# Patient Record
Sex: Female | Born: 1970 | Race: White | Hispanic: No | Smoking: Former smoker
Health system: Southern US, Community
[De-identification: ages and names within clinical notes are randomized; demographics above are authoritative.]

## PROBLEM LIST (undated history)

## (undated) DIAGNOSIS — F419 Anxiety disorder, unspecified: Secondary | ICD-10-CM

## (undated) DIAGNOSIS — I10 Essential (primary) hypertension: Secondary | ICD-10-CM

## (undated) DIAGNOSIS — F32A Depression, unspecified: Secondary | ICD-10-CM

## (undated) DIAGNOSIS — T7840XA Allergy, unspecified, initial encounter: Secondary | ICD-10-CM

## (undated) DIAGNOSIS — I82409 Acute embolism and thrombosis of unspecified deep veins of unspecified lower extremity: Secondary | ICD-10-CM

## (undated) DIAGNOSIS — M1990 Unspecified osteoarthritis, unspecified site: Secondary | ICD-10-CM

## (undated) DIAGNOSIS — E119 Type 2 diabetes mellitus without complications: Secondary | ICD-10-CM

## (undated) DIAGNOSIS — K219 Gastro-esophageal reflux disease without esophagitis: Secondary | ICD-10-CM

## (undated) DIAGNOSIS — Z5189 Encounter for other specified aftercare: Secondary | ICD-10-CM

## (undated) DIAGNOSIS — J45909 Unspecified asthma, uncomplicated: Secondary | ICD-10-CM

## (undated) HISTORY — DX: Unspecified osteoarthritis, unspecified site: M19.90

## (undated) HISTORY — DX: Allergy, unspecified, initial encounter: T78.40XA

## (undated) HISTORY — PX: NASAL SINUS SURGERY: SHX719

## (undated) HISTORY — DX: Acute embolism and thrombosis of unspecified deep veins of unspecified lower extremity: I82.409

## (undated) HISTORY — DX: Depression, unspecified: F32.A

## (undated) HISTORY — PX: OOPHORECTOMY: SHX86

## (undated) HISTORY — DX: Unspecified asthma, uncomplicated: J45.909

## (undated) HISTORY — DX: Encounter for other specified aftercare: Z51.89

## (undated) HISTORY — PX: EXPLORATORY LAPAROTOMY: SUR591

## (undated) HISTORY — PX: APPENDECTOMY: SHX54

## (undated) HISTORY — PX: BREAST SURGERY: SHX581

## (undated) HISTORY — PX: SUPERIOR VENA CAVA ANGIOPLASTY / STENTING: SHX2469

---

## 1997-01-07 HISTORY — PX: BREAST EXCISIONAL BIOPSY: SUR124

## 2001-10-06 ENCOUNTER — Ambulatory Visit: Admission: RE | Admit: 2001-10-06 | Discharge: 2001-10-06 | Payer: Self-pay | Admitting: Gynecology

## 2004-02-17 ENCOUNTER — Emergency Department: Payer: Self-pay | Admitting: Emergency Medicine

## 2005-08-20 ENCOUNTER — Emergency Department: Payer: Self-pay | Admitting: Emergency Medicine

## 2005-10-26 ENCOUNTER — Emergency Department: Payer: Self-pay | Admitting: Emergency Medicine

## 2005-12-17 ENCOUNTER — Emergency Department: Payer: Self-pay | Admitting: Emergency Medicine

## 2006-10-05 ENCOUNTER — Emergency Department: Payer: Self-pay | Admitting: Emergency Medicine

## 2006-10-05 ENCOUNTER — Other Ambulatory Visit: Payer: Self-pay

## 2007-04-01 ENCOUNTER — Emergency Department: Payer: Self-pay | Admitting: Emergency Medicine

## 2007-05-10 ENCOUNTER — Emergency Department: Payer: Self-pay | Admitting: Internal Medicine

## 2007-07-17 ENCOUNTER — Emergency Department: Payer: Self-pay | Admitting: Emergency Medicine

## 2007-07-19 ENCOUNTER — Emergency Department: Payer: Self-pay | Admitting: Emergency Medicine

## 2007-10-08 ENCOUNTER — Emergency Department: Payer: Self-pay | Admitting: Emergency Medicine

## 2007-10-08 ENCOUNTER — Other Ambulatory Visit: Payer: Self-pay

## 2007-10-09 ENCOUNTER — Emergency Department: Payer: Self-pay | Admitting: Internal Medicine

## 2008-02-01 ENCOUNTER — Emergency Department: Payer: Self-pay | Admitting: Emergency Medicine

## 2008-09-14 ENCOUNTER — Emergency Department: Payer: Self-pay | Admitting: Emergency Medicine

## 2009-02-04 ENCOUNTER — Emergency Department: Payer: Self-pay | Admitting: Emergency Medicine

## 2009-04-07 ENCOUNTER — Emergency Department: Payer: Self-pay

## 2009-04-21 ENCOUNTER — Emergency Department: Payer: Self-pay | Admitting: Emergency Medicine

## 2009-04-22 ENCOUNTER — Emergency Department: Payer: Self-pay | Admitting: Emergency Medicine

## 2009-04-28 ENCOUNTER — Emergency Department: Payer: Self-pay | Admitting: Internal Medicine

## 2009-07-03 DIAGNOSIS — G4700 Insomnia, unspecified: Secondary | ICD-10-CM | POA: Insufficient documentation

## 2009-08-15 ENCOUNTER — Emergency Department: Payer: Self-pay | Admitting: Emergency Medicine

## 2009-09-04 ENCOUNTER — Emergency Department: Payer: Self-pay | Admitting: Emergency Medicine

## 2010-07-23 ENCOUNTER — Emergency Department: Payer: Self-pay | Admitting: Emergency Medicine

## 2011-03-04 ENCOUNTER — Emergency Department: Payer: Self-pay | Admitting: Emergency Medicine

## 2011-03-04 LAB — CBC
HGB: 11.4 g/dL — ABNORMAL LOW (ref 12.0–16.0)
MCHC: 33.3 g/dL (ref 32.0–36.0)
Platelet: 276 10*3/uL (ref 150–440)
RBC: 3.71 10*6/uL — ABNORMAL LOW (ref 3.80–5.20)

## 2011-03-04 LAB — COMPREHENSIVE METABOLIC PANEL
Alkaline Phosphatase: 103 U/L (ref 50–136)
Bilirubin,Total: 0.2 mg/dL (ref 0.2–1.0)
Calcium, Total: 9.5 mg/dL (ref 8.5–10.1)
Chloride: 100 mmol/L (ref 98–107)
EGFR (African American): >60
EGFR (Non-African Amer.): >60
Glucose: 147 mg/dL — ABNORMAL HIGH (ref 65–99)
Potassium: 4.2 mmol/L (ref 3.5–5.1)
SGOT(AST): 23 U/L (ref 15–37)
SGPT (ALT): 28 U/L
Sodium: 140 mmol/L (ref 136–145)
Total Protein: 7.7 g/dL (ref 6.4–8.2)

## 2011-03-04 LAB — URINALYSIS, COMPLETE
Bacteria: NONE SEEN
Bilirubin,UR: NEGATIVE
Glucose,UR: NEGATIVE mg/dL (ref 0–75)
Nitrite: NEGATIVE
Protein: NEGATIVE
RBC,UR: 1 /HPF (ref 0–5)
Specific Gravity: 1.008 (ref 1.003–1.030)
Squamous Epithelial: 1
WBC UR: <1 /HPF (ref 0–5)

## 2011-03-04 LAB — CK TOTAL AND CKMB (NOT AT ARMC)
CK, Total: 81 U/L (ref 21–215)
CK-MB: 0.5 ng/mL (ref 0.5–3.6)

## 2011-03-04 LAB — TROPONIN I: Troponin-I: <0.02 ng/mL

## 2011-05-27 ENCOUNTER — Emergency Department: Payer: Self-pay | Admitting: *Deleted

## 2011-05-27 LAB — COMPREHENSIVE METABOLIC PANEL
Albumin: 3.4 g/dL (ref 3.4–5.0)
Alkaline Phosphatase: 110 U/L (ref 50–136)
Bilirubin,Total: 0.3 mg/dL (ref 0.2–1.0)
Calcium, Total: 8.9 mg/dL (ref 8.5–10.1)
Chloride: 105 mmol/L (ref 98–107)
EGFR (African American): >60
EGFR (Non-African Amer.): >60
Osmolality: 278 (ref 275–301)
Potassium: 4.2 mmol/L (ref 3.5–5.1)
SGOT(AST): 30 U/L (ref 15–37)
SGPT (ALT): 27 U/L
Sodium: 140 mmol/L (ref 136–145)

## 2011-05-27 LAB — URINALYSIS, COMPLETE
Bacteria: NONE SEEN
Ketone: NEGATIVE
Leukocyte Esterase: NEGATIVE
Protein: NEGATIVE
RBC,UR: NONE SEEN /HPF (ref 0–5)
Specific Gravity: 1.006 (ref 1.003–1.030)
WBC UR: <1 /HPF (ref 0–5)

## 2011-05-27 LAB — CBC
HCT: 35 % (ref 35.0–47.0)
MCH: 30.9 pg (ref 26.0–34.0)
MCV: 95 fL (ref 80–100)
Platelet: 215 10*3/uL (ref 150–440)
RBC: 3.71 10*6/uL — ABNORMAL LOW (ref 3.80–5.20)
RDW: 14.6 % — ABNORMAL HIGH (ref 11.5–14.5)
WBC: 8.9 10*3/uL (ref 3.6–11.0)

## 2011-05-27 LAB — TROPONIN I: Troponin-I: <0.02 ng/mL

## 2011-06-10 DIAGNOSIS — E669 Obesity, unspecified: Secondary | ICD-10-CM | POA: Insufficient documentation

## 2011-06-10 DIAGNOSIS — D509 Iron deficiency anemia, unspecified: Secondary | ICD-10-CM | POA: Insufficient documentation

## 2011-07-24 ENCOUNTER — Ambulatory Visit: Payer: Self-pay | Admitting: Internal Medicine

## 2011-07-30 ENCOUNTER — Emergency Department: Payer: Self-pay | Admitting: Emergency Medicine

## 2011-07-30 LAB — COMPREHENSIVE METABOLIC PANEL
Albumin: 3.7 g/dL (ref 3.4–5.0)
Alkaline Phosphatase: 121 U/L (ref 50–136)
Anion Gap: 11 (ref 7–16)
Bilirubin,Total: 0.3 mg/dL (ref 0.2–1.0)
Co2: 25 mmol/L (ref 21–32)
Creatinine: 0.89 mg/dL (ref 0.60–1.30)
Osmolality: 284 (ref 275–301)
Potassium: 3.8 mmol/L (ref 3.5–5.1)
SGPT (ALT): 41 U/L
Sodium: 141 mmol/L (ref 136–145)

## 2011-07-30 LAB — CBC
HCT: 37.9 % (ref 35.0–47.0)
MCV: 96 fL (ref 80–100)
Platelet: 232 10*3/uL (ref 150–440)
RBC: 3.96 10*6/uL (ref 3.80–5.20)
WBC: 7.3 10*3/uL (ref 3.6–11.0)

## 2011-07-30 LAB — URINALYSIS, COMPLETE
Bacteria: NONE SEEN
Blood: NEGATIVE
Glucose,UR: NEGATIVE mg/dL (ref 0–75)
Leukocyte Esterase: NEGATIVE
Nitrite: NEGATIVE
Ph: 6 (ref 4.5–8.0)
Protein: NEGATIVE
RBC,UR: NONE SEEN /HPF (ref 0–5)
Specific Gravity: 1.001 (ref 1.003–1.030)
WBC UR: NONE SEEN /HPF (ref 0–5)

## 2011-07-30 LAB — TROPONIN I: Troponin-I: <0.02 ng/mL

## 2011-08-08 ENCOUNTER — Ambulatory Visit: Payer: Self-pay | Admitting: Internal Medicine

## 2012-02-07 ENCOUNTER — Emergency Department: Payer: Self-pay | Admitting: Emergency Medicine

## 2012-02-29 ENCOUNTER — Emergency Department: Payer: Self-pay | Admitting: Emergency Medicine

## 2012-06-02 ENCOUNTER — Emergency Department: Payer: Self-pay | Admitting: Emergency Medicine

## 2012-06-02 LAB — URINALYSIS, COMPLETE
Bacteria: NONE SEEN
Bilirubin,UR: NEGATIVE
Glucose,UR: NEGATIVE mg/dL (ref 0–75)
Ketone: NEGATIVE
Leukocyte Esterase: NEGATIVE
Nitrite: NEGATIVE
Ph: 6 (ref 4.5–8.0)
RBC,UR: <1 /HPF (ref 0–5)
Squamous Epithelial: 1
WBC UR: 1 /HPF (ref 0–5)

## 2012-06-02 LAB — CBC
HCT: 36.1 % (ref 35.0–47.0)
MCV: 92 fL (ref 80–100)
Platelet: 269 10*3/uL (ref 150–440)
RDW: 15 % — ABNORMAL HIGH (ref 11.5–14.5)
WBC: 9 10*3/uL (ref 3.6–11.0)

## 2012-06-02 LAB — COMPREHENSIVE METABOLIC PANEL
Anion Gap: 8 (ref 7–16)
Glucose: 159 mg/dL — ABNORMAL HIGH (ref 65–99)
Osmolality: 274 (ref 275–301)
Potassium: 3.6 mmol/L (ref 3.5–5.1)
Sodium: 135 mmol/L — ABNORMAL LOW (ref 136–145)

## 2012-08-15 ENCOUNTER — Emergency Department: Payer: Self-pay | Admitting: Emergency Medicine

## 2012-08-15 LAB — CBC
HCT: 34 % — ABNORMAL LOW (ref 35.0–47.0)
MCH: 31.5 pg (ref 26.0–34.0)
MCV: 92 fL (ref 80–100)
RDW: 15.2 % — ABNORMAL HIGH (ref 11.5–14.5)
WBC: 9.2 10*3/uL (ref 3.6–11.0)

## 2012-08-15 LAB — COMPREHENSIVE METABOLIC PANEL
Albumin: 3.5 g/dL (ref 3.4–5.0)
Anion Gap: 7 (ref 7–16)
Bilirubin,Total: 0.3 mg/dL (ref 0.2–1.0)
Calcium, Total: 9.2 mg/dL (ref 8.5–10.1)
EGFR (African American): >60
EGFR (Non-African Amer.): >60
Glucose: 160 mg/dL — ABNORMAL HIGH (ref 65–99)
Osmolality: 278 (ref 275–301)
Potassium: 3.6 mmol/L (ref 3.5–5.1)
SGPT (ALT): 41 U/L (ref 12–78)
Sodium: 137 mmol/L (ref 136–145)

## 2012-08-15 LAB — TROPONIN I
Troponin-I: <0.02 ng/mL
Troponin-I: <0.02 ng/mL

## 2013-01-25 ENCOUNTER — Emergency Department: Payer: Self-pay | Admitting: Emergency Medicine

## 2013-01-25 LAB — CBC WITH DIFFERENTIAL/PLATELET
BASOS ABS: 0.1 10*3/uL (ref 0.0–0.1)
Basophil %: 1.2 %
Eosinophil #: 0.1 10*3/uL (ref 0.0–0.7)
Eosinophil %: 1 %
HCT: 34.8 % — ABNORMAL LOW (ref 35.0–47.0)
HGB: 11.7 g/dL — ABNORMAL LOW (ref 12.0–16.0)
LYMPHS ABS: 1.3 10*3/uL (ref 1.0–3.6)
Lymphocyte %: 14.4 %
MCH: 31.2 pg (ref 26.0–34.0)
MCHC: 33.7 g/dL (ref 32.0–36.0)
MCV: 93 fL (ref 80–100)
MONO ABS: 0.5 x10 3/mm (ref 0.2–0.9)
Monocyte %: 5.3 %
Neutrophil #: 7.2 10*3/uL — ABNORMAL HIGH (ref 1.4–6.5)
Neutrophil %: 78.1 %
PLATELETS: 263 10*3/uL (ref 150–440)
RBC: 3.76 10*6/uL — ABNORMAL LOW (ref 3.80–5.20)
RDW: 15.4 % — AB (ref 11.5–14.5)
WBC: 9.2 10*3/uL (ref 3.6–11.0)

## 2013-01-25 LAB — COMPREHENSIVE METABOLIC PANEL
ALBUMIN: 3.4 g/dL (ref 3.4–5.0)
Alkaline Phosphatase: 92 U/L
Anion Gap: 4 — ABNORMAL LOW (ref 7–16)
BUN: 12 mg/dL (ref 7–18)
Bilirubin,Total: 0.3 mg/dL (ref 0.2–1.0)
CHLORIDE: 100 mmol/L (ref 98–107)
CREATININE: 0.71 mg/dL (ref 0.60–1.30)
Calcium, Total: 9.4 mg/dL (ref 8.5–10.1)
Co2: 31 mmol/L (ref 21–32)
EGFR (African American): >60
EGFR (Non-African Amer.): >60
GLUCOSE: 110 mg/dL — AB (ref 65–99)
Osmolality: 270 (ref 275–301)
POTASSIUM: 3.4 mmol/L — AB (ref 3.5–5.1)
SGOT(AST): 39 U/L — ABNORMAL HIGH (ref 15–37)
SGPT (ALT): 29 U/L (ref 12–78)
SODIUM: 135 mmol/L — AB (ref 136–145)
TOTAL PROTEIN: 7.4 g/dL (ref 6.4–8.2)

## 2013-04-26 IMAGING — CR DG CHEST 2V
1 series · 2 of 2 positions shown · non-contrast
Comparison: none

REASON FOR EXAM: chest pressure
COMMENTS:   May transport without cardiac monitor

[Series 1: pa · 0.17mm/px · 2 of 2 slices shown]
[im 1/2]
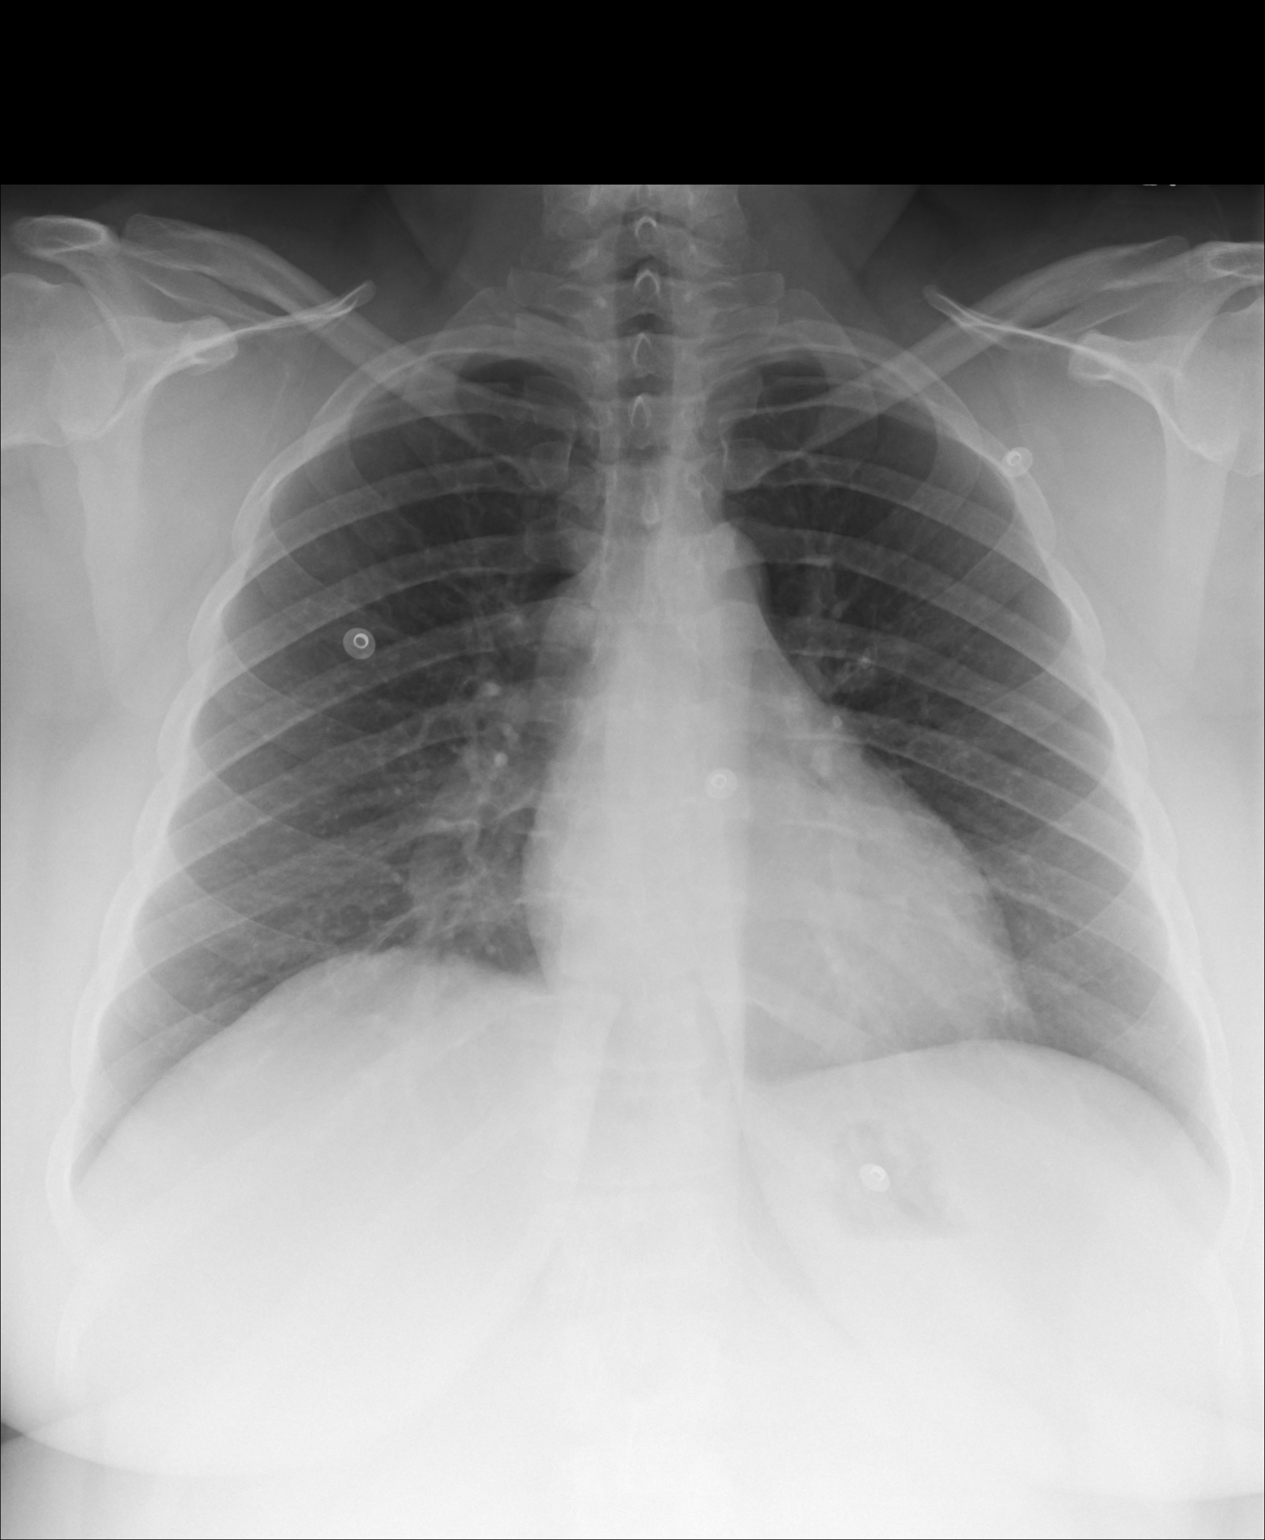
[im 2/2]
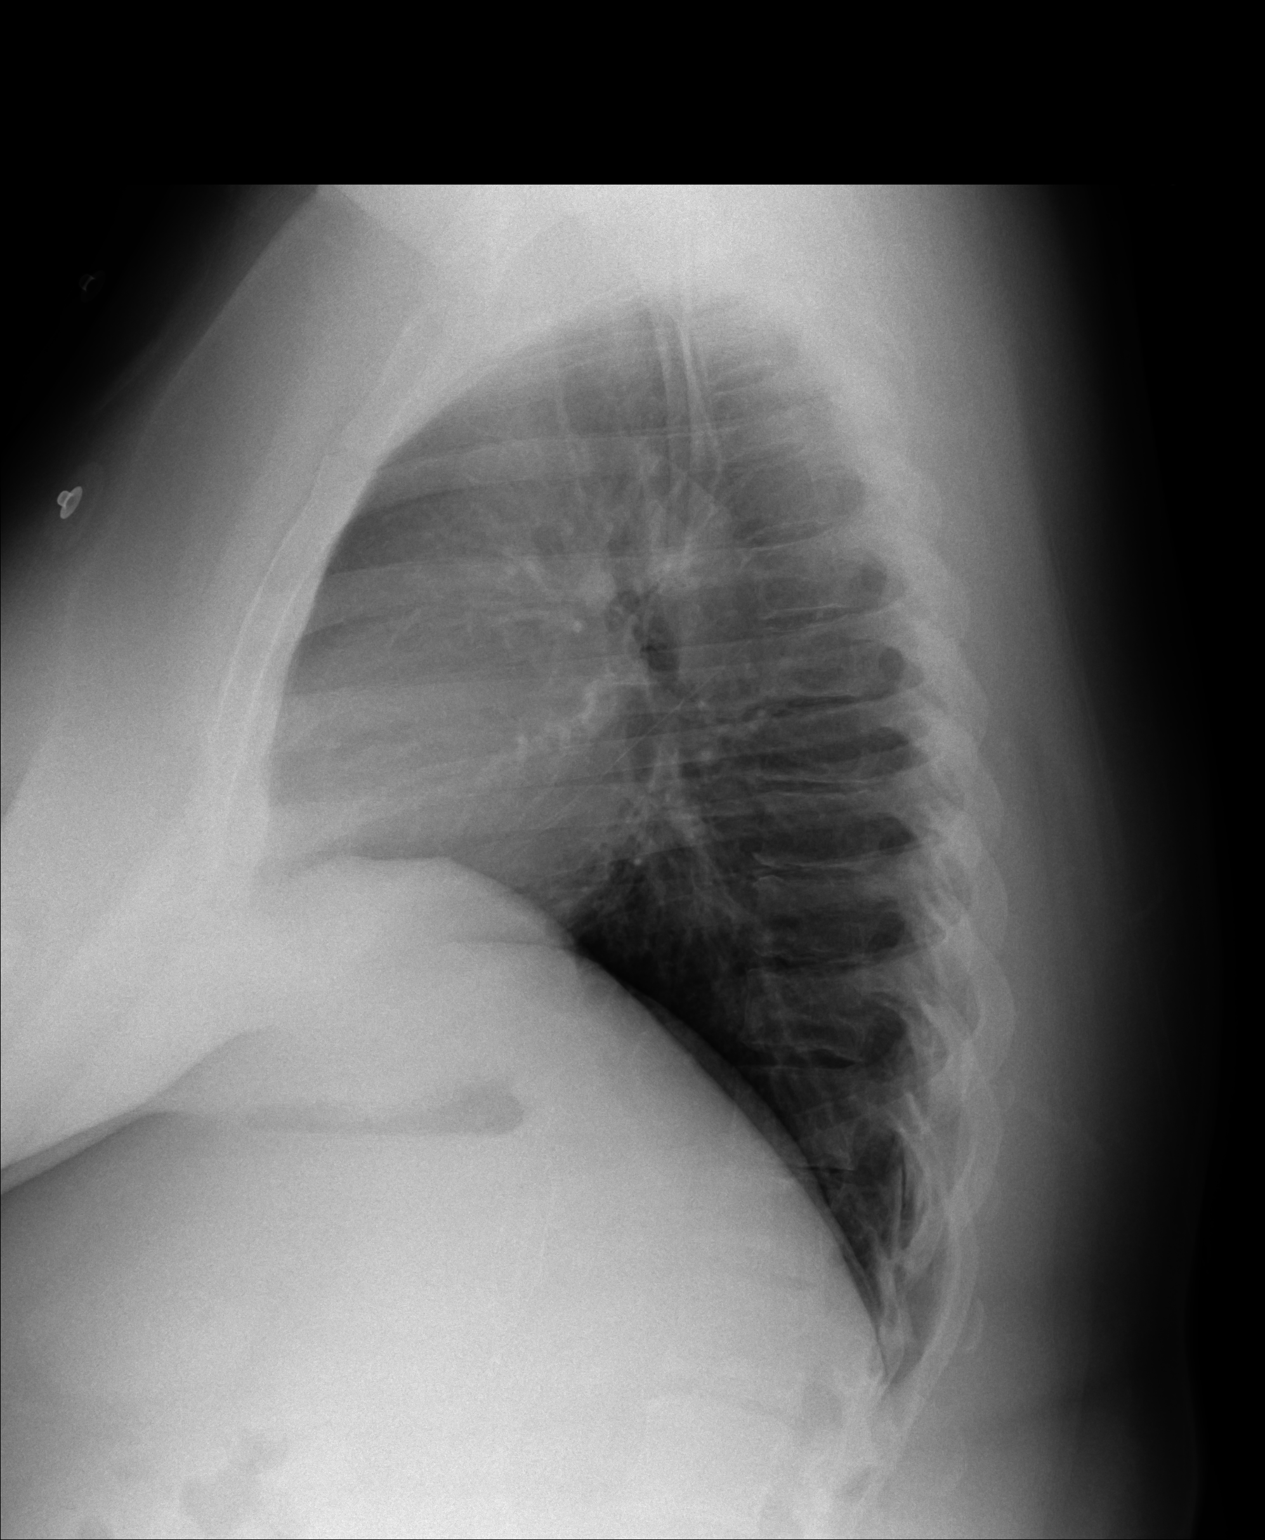

[2 of 2 positions shown; findings below may reference images not displayed]

PROCEDURE:     DXR - DXR CHEST PA (OR AP) AND LATERAL  - July 30, 2011  [DATE]

RESULT:     Comparison is made to the study 27 May, 2011.

The lungs are clear. The heart and pulmonary vessels are normal. The bony
and mediastinal structures are unremarkable. There is no effusion. There is
no pneumothorax or evidence of congestive failure.
IMPRESSION: No acute cardiopulmonary disease. Stable appearance.

[REDACTED]

## 2014-08-03 ENCOUNTER — Ambulatory Visit: Payer: Self-pay

## 2014-08-10 ENCOUNTER — Ambulatory Visit: Payer: Self-pay

## 2014-08-24 ENCOUNTER — Ambulatory Visit: Payer: Self-pay

## 2014-10-11 ENCOUNTER — Emergency Department
Admission: EM | Admit: 2014-10-11 | Discharge: 2014-10-11 | Disposition: A | Discharge status: Home / Self Care | Payer: Self-pay | Attending: Emergency Medicine | Admitting: Emergency Medicine

## 2014-10-11 DIAGNOSIS — L600 Ingrowing nail: Secondary | ICD-10-CM | POA: Insufficient documentation

## 2014-10-11 DIAGNOSIS — E119 Type 2 diabetes mellitus without complications: Secondary | ICD-10-CM | POA: Insufficient documentation

## 2014-10-11 DIAGNOSIS — H6502 Acute serous otitis media, left ear: Secondary | ICD-10-CM | POA: Insufficient documentation

## 2014-10-11 MED ORDER — CEPHALEXIN 500 MG PO CAPS: 500.0000 mg | ORAL_CAPSULE | Freq: Once | ORAL | Status: DC

## 2014-10-11 MED ORDER — CEPHALEXIN 500 MG PO CAPS: 500.0000 mg | ORAL_CAPSULE | Freq: Three times a day (TID) | ORAL | Status: DC

## 2014-10-11 NOTE — ED Notes (Signed)
Pt c/o left great toe nail en grown with drainage

## 2014-10-11 NOTE — Discharge Instructions (Signed)
Ingrown Toenail An ingrown toenail occurs when the corner or sides of your toenail grow into the surrounding skin. The big toe is most commonly affected, but it can happen to any of your toes. If your ingrown toenail is not treated, you will be at risk for infection. CAUSES This condition may be caused by:  Wearing shoes that are too small or tight.  Injury or trauma, such as stubbing your toe or having your toe stepped on.  Improper cutting or care of your toenails.  Being born with (congenital) nail or foot abnormalities, such as having a nail that is too big for your toe. RISK FACTORS Risk factors for an ingrown toenail include:  Age. Your nails tend to thicken as you get older, so ingrown nails are more common in older people.  Diabetes.  Cutting your toenails incorrectly.  Blood circulation problems. SYMPTOMS Symptoms may include:  Pain, soreness, or tenderness.  Redness.  Swelling.  Hardening of the skin surrounding the toe. Your ingrown toenail may be infected if there is fluid, pus, or drainage. DIAGNOSIS  An ingrown toenail may be diagnosed by medical history and physical exam. If your toenail is infected, your health care provider may test a sample of the drainage. TREATMENT Treatment depends on the severity of your ingrown toenail. Some ingrown toenails may be treated at home. More severe or infected ingrown toenails may require surgery to remove all or part of the nail. Infected ingrown toenails may also be treated with antibiotic medicines. HOME CARE INSTRUCTIONS  If you were prescribed an antibiotic medicine, finish all of it even if you start to feel better.  Soak your foot in warm soapy water for 20 minutes, 3 times per day or as directed by your health care provider.  Carefully lift the edge of the nail away from the sore skin by wedging a small piece of cotton under the corner of the nail. This may help with the pain. Be careful not to cause more injury  to the area.  Wear shoes that fit well. If your ingrown toenail is causing you pain, try wearing sandals, if possible.  Trim your toenails regularly and carefully. Do not cut them in a curved shape. Cut your toenails straight across. This prevents injury to the skin at the corners of the toenail.  Keep your feet clean and dry.  If you are having trouble walking and are given crutches by your health care provider, use them as directed.  Do not pick at your toenail or try to remove it yourself.  Take medicines only as directed by your health care provider.  Keep all follow-up visits as directed by your health care provider. This is important. SEEK MEDICAL CARE IF:  Your symptoms do not improve with treatment. SEEK IMMEDIATE MEDICAL CARE IF:  You have red streaks that start at your foot and go up your leg.  You have a fever.  You have increased redness, swelling, or pain.  You have fluid, blood, or pus coming from your toenail.   This information is not intended to replace advice given to you by your health care provider. Make sure you discuss any questions you have with your health care provider.   Document Released: 12/22/1999 Document Revised: 05/10/2014 Document Reviewed: 11/17/2013 Elsevier Interactive Patient Education [employer]  Take the antibiotic as directed, until completely gone. Soak the toe in warm, water + epsom salt daily.  Follow-up with Dr. Orland Jarred for definitve treatment.

## 2014-10-11 NOTE — ED Notes (Signed)
Patient c/o pain in L big toe. Patient states " I have an ingrown toenail X3 weeks". Patient reports she popped a pocket full of pus next to toe. Toe looks red and swollen

## 2014-10-11 NOTE — ED Provider Notes (Signed)
[facility] Emergency Department Provider Note ____________________________________________  Time seen: 50  I have reviewed the triage vital signs and the nursing notes.  HISTORY  Chief Complaint  Nail Problem  HPI Jamie Massey is a 44 y.o. female reports to the ED for evaluation of tenderness to the lateral aspect of the left great toe at the nail. She describes she's had an ingrown nail for the last several months, and has been trying to manage at home. She is a confirmed diabetic, who on last night tapped small pus pocket at the end of the nail with a heated pin.She has over the last several weeks experience van degrees of pain and swelling to the left lateral aspect of the great toe at the nail. She denies any outright fevers, chills, sweats, or drainage. She rates her pain at 8/10 in triage. She is been wearing flip flops for comfort and support regularly.  No past medical history on file.  There are no active problems to display for this patient.  No past surgical history on file.  Current Outpatient Rx  Name  Route  Sig  Dispense  Refill  . cephALEXin (KEFLEX) 500 MG capsule   Oral   Take 1 capsule (500 mg total) by mouth 3 (three) times daily.   21 capsule   0    Allergies Review of patient's allergies indicates not on file.  No family history on file.  Social History Social History  Substance Use Topics  . Smoking status: Not on file  . Smokeless tobacco: Not on file  . Alcohol Use: Not on file   Review of Systems  Constitutional: Negative for fever. Eyes: Negative for visual changes. ENT: Negative for sore throat. Cardiovascular: Negative for chest pain. Respiratory: Negative for shortness of breath. Gastrointestinal: Negative for abdominal pain, vomiting and diarrhea. Genitourinary: Negative for dysuria. Musculoskeletal: Negative for back pain. Skin: Negative for rash. Left great toe redness as above. Neurological: Negative for  headaches, focal weakness or numbness. ____________________________________________  PHYSICAL EXAM:  VITAL SIGNS: ED Triage Vitals  Enc Vitals Group     BP 10/11/14 1736 134/71 mmHg     Pulse Rate 10/11/14 1736 87     Resp --      Temp 10/11/14 1736 98.2 F (36.8 C)     Temp Source 10/11/14 1736 Oral     SpO2 10/11/14 1736 98 %     Weight 10/11/14 1736 180 lb (81.647 kg)     Height 10/11/14 1736  (1.549 m)     Head Cir --      Peak Flow --      Pain Score 10/11/14 1721 8     Pain Loc --      Pain Edu? --      Excl. in GC? --    Constitutional: Alert and oriented. Well appearing and in no distress. Eyes: Conjunctivae are normal. PERRL. Normal extraocular movements. Ears: Canals clear, and TMs are erythematous, retracted, with a small serous effusion noted bilaterally.   Head: Normocephalic and atraumatic.   Nose: No congestion/rhinorrhea.   Mouth/Throat: Mucous membranes are moist.   Neck: Supple. No thyromegaly. Hematological/Lymphatic/Immunological: No cervical lymphadenopathy. Cardiovascular: Normal rate, regular rhythm.  Respiratory: Normal respiratory effort. No wheezes/rales/rhonchi. Gastrointestinal: Soft and nontender. No distention. Musculoskeletal: Nontender with normal range of motion in all extremities.  Neurologic:  Normal gait without ataxia. Normal speech and language. No gross focal neurologic deficits are appreciated. Skin:  Skin is warm, dry and  intact. No rash noted. Left great toe with distal and lateral erythema and minimal edema noted. There is a small dried blood scab to the lateral corner of the distal nail. Psychiatric: Mood and affect are normal. Patient exhibits appropriate insight and judgment. ____________________________________________  PROCEDURES  Keflex 500 mg PO ____________________________________________  INITIAL IMPRESSION / ASSESSMENT AND PLAN / ED COURSE  Patient with local cellulitis with underlying ingrown  toenail on the left great toe. Active surgical intervention is delayed at this time, the patient is referred to Dr. Orland Jarred for effective management of ingrown toenail. Patient will be started on Keflex for local saline is. She is encouraged to soak the toe in warm Epsom salts and refrain from any self management. ____________________________________________  FINAL CLINICAL IMPRESSION(S) / ED DIAGNOSES  Final diagnoses:  Ingrown left big toenail  Acute serous otitis media of left ear, recurrence not specified      Lissa Hoard, PA-C 10/11/14 1909  Darien Ramus, MD 10/11/14 2328

## 2015-02-12 ENCOUNTER — Encounter: Payer: Self-pay | Admitting: Emergency Medicine

## 2015-02-12 ENCOUNTER — Inpatient Hospital Stay
Admission: EM | Admit: 2015-02-12 | Discharge: 2015-02-14 | DRG: 392 | Disposition: A | Discharge status: Home / Self Care | Payer: Medicaid Other | Attending: Internal Medicine | Admitting: Internal Medicine

## 2015-02-12 DIAGNOSIS — F419 Anxiety disorder, unspecified: Secondary | ICD-10-CM | POA: Diagnosis present

## 2015-02-12 DIAGNOSIS — E860 Dehydration: Secondary | ICD-10-CM | POA: Diagnosis present

## 2015-02-12 DIAGNOSIS — E1169 Type 2 diabetes mellitus with other specified complication: Secondary | ICD-10-CM | POA: Diagnosis present

## 2015-02-12 DIAGNOSIS — R109 Unspecified abdominal pain: Secondary | ICD-10-CM

## 2015-02-12 DIAGNOSIS — Z9889 Other specified postprocedural states: Secondary | ICD-10-CM

## 2015-02-12 DIAGNOSIS — I10 Essential (primary) hypertension: Secondary | ICD-10-CM | POA: Diagnosis present

## 2015-02-12 DIAGNOSIS — E872 Acidosis, unspecified: Secondary | ICD-10-CM | POA: Diagnosis present

## 2015-02-12 DIAGNOSIS — R197 Diarrhea, unspecified: Secondary | ICD-10-CM

## 2015-02-12 DIAGNOSIS — R112 Nausea with vomiting, unspecified: Secondary | ICD-10-CM

## 2015-02-12 DIAGNOSIS — K529 Noninfective gastroenteritis and colitis, unspecified: Principal | ICD-10-CM | POA: Diagnosis present

## 2015-02-12 DIAGNOSIS — E876 Hypokalemia: Secondary | ICD-10-CM | POA: Diagnosis present

## 2015-02-12 DIAGNOSIS — R7989 Other specified abnormal findings of blood chemistry: Secondary | ICD-10-CM

## 2015-02-12 DIAGNOSIS — Z87891 Personal history of nicotine dependence: Secondary | ICD-10-CM

## 2015-02-12 DIAGNOSIS — E119 Type 2 diabetes mellitus without complications: Secondary | ICD-10-CM | POA: Diagnosis present

## 2015-02-12 DIAGNOSIS — K219 Gastro-esophageal reflux disease without esophagitis: Secondary | ICD-10-CM | POA: Diagnosis present

## 2015-02-12 HISTORY — DX: Gastro-esophageal reflux disease without esophagitis: K21.9

## 2015-02-12 HISTORY — DX: Type 2 diabetes mellitus without complications: E11.9

## 2015-02-12 HISTORY — DX: Essential (primary) hypertension: I10

## 2015-02-12 HISTORY — DX: Anxiety disorder, unspecified: F41.9

## 2015-02-12 LAB — URINALYSIS COMPLETE WITH MICROSCOPIC (ARMC ONLY)
Glucose, UA: NEGATIVE mg/dL
HGB URINE DIPSTICK: NEGATIVE
LEUKOCYTES UA: NEGATIVE
NITRITE: NEGATIVE
PH: 5 (ref 5.0–8.0)
Protein, ur: 100 mg/dL — AB
SPECIFIC GRAVITY, URINE: 1.03 (ref 1.005–1.030)

## 2015-02-12 LAB — COMPREHENSIVE METABOLIC PANEL
ALBUMIN: 4.2 g/dL (ref 3.5–5.0)
ALT: 21 U/L (ref 14–54)
ANION GAP: 14 (ref 5–15)
AST: 25 U/L (ref 15–41)
Alkaline Phosphatase: 90 U/L (ref 38–126)
BUN: 21 mg/dL — ABNORMAL HIGH (ref 6–20)
CHLORIDE: 97 mmol/L — AB (ref 101–111)
CO2: 27 mmol/L (ref 22–32)
Calcium: 9.8 mg/dL (ref 8.9–10.3)
Creatinine, Ser: 0.85 mg/dL (ref 0.44–1.00)
GFR calc non Af Amer: >60 mL/min (ref >60)
GLUCOSE: 193 mg/dL — AB (ref 65–99)
Potassium: 3.1 mmol/L — ABNORMAL LOW (ref 3.5–5.1)
SODIUM: 138 mmol/L (ref 135–145)
Total Bilirubin: 0.7 mg/dL (ref 0.3–1.2)
Total Protein: 8.2 g/dL — ABNORMAL HIGH (ref 6.5–8.1)

## 2015-02-12 LAB — CBC WITH DIFFERENTIAL/PLATELET
Basophils Absolute: 0 10*3/uL (ref 0–0.1)
Basophils Relative: 0 %
EOS PCT: 0 %
Eosinophils Absolute: 0.1 10*3/uL (ref 0–0.7)
HCT: 39 % (ref 35.0–47.0)
Hemoglobin: 13.1 g/dL (ref 12.0–16.0)
LYMPHS ABS: 1.1 10*3/uL (ref 1.0–3.6)
LYMPHS PCT: 8 %
MCH: 30.7 pg (ref 26.0–34.0)
MCHC: 33.6 g/dL (ref 32.0–36.0)
MCV: 91.5 fL (ref 80.0–100.0)
MONO ABS: 0.6 10*3/uL (ref 0.2–0.9)
MONOS PCT: 4 %
Neutro Abs: 12 10*3/uL — ABNORMAL HIGH (ref 1.4–6.5)
Neutrophils Relative %: 88 %
PLATELETS: 282 10*3/uL (ref 150–440)
RBC: 4.26 MIL/uL (ref 3.80–5.20)
RDW: 14.4 % (ref 11.5–14.5)
WBC: 13.8 10*3/uL — ABNORMAL HIGH (ref 3.6–11.0)

## 2015-02-12 LAB — LIPASE, BLOOD: LIPASE: 33 U/L (ref 11–51)

## 2015-02-12 LAB — TROPONIN I: Troponin I: <0.03 ng/mL (ref <0.031)

## 2015-02-12 LAB — LACTIC ACID, PLASMA: Lactic Acid, Venous: 2.5 mmol/L (ref 0.5–2.0)

## 2015-02-12 LAB — PREGNANCY, URINE: Preg Test, Ur: NEGATIVE

## 2015-02-12 MED ORDER — INSULIN ASPART 100 UNIT/ML ~~LOC~~ SOLN
0.0000 [IU] | Freq: Four times a day (QID) | SUBCUTANEOUS | Status: DC
  Administered 2015-02-13: 2 [IU] via SUBCUTANEOUS
  Administered 2015-02-13 – 2015-02-14 (×4): 1 [IU] via SUBCUTANEOUS
  Administered 2015-02-14: 2 [IU] via SUBCUTANEOUS
  Filled 2015-02-12 (×2): qty 1
  Filled 2015-02-12: qty 2
  Filled 2015-02-12: qty 1
  Filled 2015-02-12: qty 2
  Filled 2015-02-12: qty 1

## 2015-02-12 MED ORDER — ONDANSETRON HCL 4 MG/2ML IJ SOLN: 8.0000 mg | Freq: Once | INTRAMUSCULAR | Status: DC

## 2015-02-12 MED ORDER — FAMOTIDINE 20 MG PO TABS
20.0000 mg | ORAL_TABLET | Freq: Two times a day (BID) | ORAL | Status: DC
  Administered 2015-02-13 – 2015-02-14 (×4): 20 mg via ORAL
  Filled 2015-02-12 (×4): qty 1

## 2015-02-12 MED ORDER — ONDANSETRON HCL 4 MG PO TABS: 4.0000 mg | ORAL_TABLET | Freq: Four times a day (QID) | ORAL | Status: DC | PRN

## 2015-02-12 MED ORDER — MORPHINE SULFATE (PF) 2 MG/ML IV SOLN
2.0000 mg | Freq: Once | INTRAVENOUS | Status: AC
  Administered 2015-02-12: 2 mg via INTRAVENOUS
  Filled 2015-02-12: qty 1

## 2015-02-12 MED ORDER — SODIUM CHLORIDE 0.9% FLUSH
3.0000 mL | Freq: Two times a day (BID) | INTRAVENOUS | Status: DC
  Administered 2015-02-13: 3 mL via INTRAVENOUS

## 2015-02-12 MED ORDER — SODIUM CHLORIDE 0.9 % IV SOLN
Freq: Once | INTRAVENOUS | Status: AC
  Administered 2015-02-12: 21:00:00 via INTRAVENOUS

## 2015-02-12 MED ORDER — ACETAMINOPHEN 325 MG PO TABS
650.0000 mg | ORAL_TABLET | Freq: Four times a day (QID) | ORAL | Status: DC | PRN
Start: 2015-02-12 — End: 2015-02-14
  Administered 2015-02-14: 650 mg via ORAL
  Filled 2015-02-12: qty 2

## 2015-02-12 MED ORDER — HYDROXYZINE HCL 25 MG PO TABS
25.0000 mg | ORAL_TABLET | Freq: Every evening | ORAL | Status: DC | PRN
  Administered 2015-02-13: 25 mg via ORAL
  Filled 2015-02-12: qty 1

## 2015-02-12 MED ORDER — ENOXAPARIN SODIUM 40 MG/0.4ML ~~LOC~~ SOLN
40.0000 mg | Freq: Two times a day (BID) | SUBCUTANEOUS | Status: DC
  Administered 2015-02-13 (×3): 40 mg via SUBCUTANEOUS
  Filled 2015-02-12 (×3): qty 0.4

## 2015-02-12 MED ORDER — SODIUM CHLORIDE 0.9 % IV SOLN
8.0000 mg | Freq: Once | INTRAVENOUS | Status: AC
  Administered 2015-02-12: 8 mg via INTRAVENOUS
  Filled 2015-02-12: qty 4

## 2015-02-12 MED ORDER — MORPHINE SULFATE (PF) 2 MG/ML IV SOLN
2.0000 mg | INTRAVENOUS | Status: DC | PRN
  Administered 2015-02-13 (×2): 2 mg via INTRAVENOUS
  Filled 2015-02-12 (×2): qty 1

## 2015-02-12 MED ORDER — SODIUM CHLORIDE 0.9 % IV SOLN
Freq: Once | INTRAVENOUS | Status: AC
  Administered 2015-02-13: 01:00:00 via INTRAVENOUS

## 2015-02-12 MED ORDER — ONDANSETRON HCL 4 MG/2ML IJ SOLN
4.0000 mg | Freq: Four times a day (QID) | INTRAMUSCULAR | Status: DC | PRN
  Administered 2015-02-13: 4 mg via INTRAVENOUS
  Filled 2015-02-12: qty 2

## 2015-02-12 MED ORDER — SODIUM CHLORIDE 0.9 % IV SOLN
INTRAVENOUS | Status: AC
  Administered 2015-02-13: via INTRAVENOUS

## 2015-02-12 MED ORDER — ONDANSETRON HCL 4 MG/2ML IJ SOLN
4.0000 mg | Freq: Once | INTRAMUSCULAR | Status: AC
  Administered 2015-02-12: 4 mg via INTRAVENOUS
  Filled 2015-02-12: qty 2

## 2015-02-12 MED ORDER — ACETAMINOPHEN 650 MG RE SUPP: 650.0000 mg | Freq: Four times a day (QID) | RECTAL | Status: DC | PRN

## 2015-02-12 MED ORDER — SODIUM CHLORIDE 0.9 % IV SOLN
Freq: Once | INTRAVENOUS | Status: AC
  Administered 2015-02-12: 23:00:00 via INTRAVENOUS

## 2015-02-12 NOTE — ED Provider Notes (Addendum)
[facility] Emergency Department Provider Note  ____________________________________________  Time seen: Approximately 9:16 PM  I have reviewed the triage vital signs and the nursing notes.   HISTORY  Chief Complaint Emesis    HPI Jamie Massey is a 45 y.o. female patient brings her daughter in for headache nausea and vomiting. On arrival in the emergency room room patient begins vomiting herself. Patient rapidly becomes ill wants to check in gets lightheaded more vomiting denies any headache chest pain fever or chills or abdominal pain. Patient does complain of diarrhea as does her daughter.   Past Medical History  Diagnosis Date  . Diabetes mellitus without complication (HCC)   . Hypertension   . GERD (gastroesophageal reflux disease)   . Anxiety     There are no active problems to display for this patient.   Past Surgical History  Procedure Laterality Date  . Breast surgery    . Nasal sinus surgery    . Exploratory laparotomy    . Oophorectomy      Current Outpatient Rx  Name  Route  Sig  Dispense  Refill  . cephALEXin (KEFLEX) 500 MG capsule   Oral   Take 1 capsule (500 mg total) by mouth 3 (three) times daily.   21 capsule   0     Allergies Review of patient's allergies indicates no known allergies.  No family history on file.  Social History Social History  Substance Use Topics  . Smoking status: Former Games developer  . Smokeless tobacco: None  . Alcohol Use: No    Review of Systems Constitutional: No fever/chills Eyes: No visual changes. ENT: No sore throat. Cardiovascular: Denies chest pain. Respiratory: Denies shortness of breath. Gastrointestinal: No abdominal pain.   nausea, vomiting.  No diarrhea.  No constipation. Genitourinary: Negative for dysuria. Musculoskeletal: Negative for back pain. Skin: Negative for rash. Neurological: Negative for headaches, focal weakness or numbness.  10-point ROS otherwise  negative.  ____________________________________________   PHYSICAL EXAM:  VITAL SIGNS: ED Triage Vitals  Enc Vitals Group     BP 02/12/15 1958 103/90 mmHg     Pulse Rate 02/12/15 1958 92     Resp 02/12/15 1958 18     Temp --      Temp src --      SpO2 02/12/15 1958 98 %     Weight --      Height --      Head Cir --      Peak Flow --      Pain Score --      Pain Loc --      Pain Edu? --      Excl. in GC? --     Constitutional: Alert and oriented. Pale and ill-appearing Eyes: Conjunctivae are normal. PERRL. EOMI. Head: Atraumatic. Nose: No congestion/rhinnorhea. Mouth/Throat: Mucous membranes are moist.  Oropharynx non-erythematous. Neck: No stridor.  Cardiovascular: Normal rate, regular rhythm. Grossly normal heart sounds.  Good peripheral circulation. Respiratory: Normal respiratory effort.  No retractions. Lungs CTAB. Gastrointestinal: Soft and nontender. No distention. No abdominal bruits. No CVA tenderness. Musculoskeletal: No lower extremity tenderness nor edema.  No joint effusions. Neurologic:  Normal speech and language. No gross focal neurologic deficits are appreciated. No gait instability. Skin:  Skin is warm, dry and intact. No rash noted. Psychiatric: Mood and affect are normal. Speech and behavior are normal.  ____________________________________________   LABS (all labs ordered are listed, but only abnormal results are displayed)  Labs Reviewed  COMPREHENSIVE METABOLIC PANEL - Abnormal; Notable for the following:    Potassium 3.1 (*)    Chloride 97 (*)    Glucose, Bld 193 (*)    BUN 21 (*)    Total Protein 8.2 (*)    All other components within normal limits  URINALYSIS COMPLETEWITH MICROSCOPIC ([facility] ONLY) - Abnormal; Notable for the following:    Color, Urine AMBER (*)    APPearance CLOUDY (*)    Bilirubin Urine 1+ (*)    Ketones, ur TRACE (*)    Protein, ur 100 (*)    Bacteria, UA RARE (*)    Squamous Epithelial / LPF 6-30 (*)    All other  components within normal limits  LACTIC ACID, PLASMA - Abnormal; Notable for the following:    Lactic Acid, Venous 2.5 (*)    All other components within normal limits  CBC WITH DIFFERENTIAL/PLATELET - Abnormal; Notable for the following:    WBC 13.8 (*)    Neutro Abs 12.0 (*)    All other components within normal limits  C DIFFICILE QUICK SCREEN W PCR REFLEX  LIPASE, BLOOD  TROPONIN I  PREGNANCY, URINE  LACTIC ACID, PLASMA   ____________________________________________  EKG  EKG read and interpreted by me shows normal sinus rhythm with a rate of 96 left axis no acute ST-T wave changes ____________________________________________  RADIOLOGY   ____________________________________________   PROCEDURES    ____________________________________________   INITIAL IMPRESSION / ASSESSMENT AND PLAN / ED COURSE  Pertinent labs & imaging results that were available during my care of the patient were reviewed by me and considered in my medical decision making (see chart for details).  She has had 2 doses of Zofran liter of IV fluid and continues to look sicker. She still complains only of nausea she is vomiting and having diarrhea that smells like C. difficile. C. difficile is been ordered. Patient's lactic acid is elevated at 2.5. Again patient look sicker than when she arrived and is essentially wilting before my eyes. We'll g.ive her more fluids and plan to admit her ____________________________________________   FINAL CLINICAL IMPRESSION(S) / ED DIAGNOSES  Final diagnoses:  Nausea and vomiting, vomiting of unspecified type  Diarrhea, unspecified type  Elevated lactic acid level      Arnaldo Natal, MD 02/12/15 2132  Patient is still not having abdominal pain in the abdomen is not tender to palpation.  Arnaldo Natal, MD 02/12/15 2133

## 2015-02-12 NOTE — H&P (Signed)
[facility] [place] [facility]   PATIENT NAME: Jamie Massey    MR#:  [id number]  DATE OF BIRTH:  May 30, 1970  DATE OF ADMISSION:  02/12/2015  PRIMARY CARE PHYSICIAN: Emogene Morgan, MD   REQUESTING/REFERRING PHYSICIAN: Darnelle Catalan, MD  CHIEF COMPLAINT:   Chief Complaint  Patient presents with  . Emesis    HISTORY OF PRESENT ILLNESS:  Jamie Massey  is a 45 y.o. female who presents with 1 day of acute abdominal pain with nausea vomiting and diarrhea. Patient states that she has had some sweats today, though no documented fever. Her daughter has similar symptoms and is also being admitted today. Patient's diarrhea is watery, and she has had some difficulty in controlling it. Workup in the ED shows leukocytosis and elevated lactic acid. Otherwise is largely within normal limits. Despite multiple rounds of antiemetics she is still nauseated. She denies any recent antibiotic use, recent sick contacts. She denies any recent travel. Denies eating any exotic or new or different cuisine. Patient was given fluids in the ED, and hospitalists were called for admission.  PAST MEDICAL HISTORY:   Past Medical History  Diagnosis Date  . Diabetes mellitus without complication (HCC)   . Hypertension   . GERD (gastroesophageal reflux disease)   . Anxiety     PAST SURGICAL HISTORY:   Past Surgical History  Procedure Laterality Date  . Breast surgery    . Nasal sinus surgery    . Exploratory laparotomy    . Oophorectomy      SOCIAL HISTORY:   Social History  Substance Use Topics  . Smoking status: Former Games developer  . Smokeless tobacco: Not on file  . Alcohol Use: No    FAMILY HISTORY:   Family History  Problem Relation Age of Onset  . Deep vein thrombosis      DRUG ALLERGIES:  No Known Allergies  MEDICATIONS AT HOME:   Prior to Admission medications   Medication Sig Start Date End Date Taking? Authorizing Provider  ALLERGY 10 MG tablet Take 10 mg by mouth  daily. 02/09/15  Yes Historical Provider, MD  hydrochlorothiazide (HYDRODIURIL) 25 MG tablet Take 25 mg by mouth daily. for high blood pressure 11/30/14  Yes Historical Provider, MD  hydrOXYzine (VISTARIL) 25 MG capsule Take 1 capsule by mouth at bedtime as needed. 02/09/15  Yes Historical Provider, MD  metFORMIN (GLUCOPHAGE) 500 MG tablet Take 500 mg by mouth 2 (two) times daily with a meal.   Yes Historical Provider, MD  ranitidine (ZANTAC) 150 MG tablet Take 1 tablet by mouth 2 (two) times daily. 11/30/14  Yes Historical Provider, MD    REVIEW OF SYSTEMS:  Review of Systems  Constitutional: Positive for fever (subjective). Negative for chills, weight loss and malaise/fatigue.  HENT: Negative for ear pain, hearing loss and tinnitus.   Eyes: Negative for blurred vision, double vision, pain and redness.  Respiratory: Negative for cough, hemoptysis and shortness of breath.   Cardiovascular: Negative for chest pain, palpitations, orthopnea and leg swelling.  Gastrointestinal: Positive for nausea, vomiting, abdominal pain and diarrhea. Negative for constipation.  Genitourinary: Negative for dysuria, frequency and hematuria.  Musculoskeletal: Negative for back pain, joint pain and neck pain.  Skin:       No acne, rash, or lesions  Neurological: Negative for dizziness, tremors, focal weakness and weakness.  Endo/Heme/Allergies: Negative for polydipsia. Does not bruise/bleed easily.  Psychiatric/Behavioral: Negative for depression. The patient is not nervous/anxious and does not have insomnia.  VITAL SIGNS:   Filed Vitals:   02/12/15 1958 02/12/15 2131  BP: 103/90 119/80  Pulse: 92 100  Temp: 97.8 F (36.6 C)   TempSrc: Oral   Resp: 18 15  SpO2: 98% 100%   Wt Readings from Last 3 Encounters:  10/11/14 81.647 kg (180 lb)    PHYSICAL EXAMINATION:  Physical Exam  Vitals reviewed. Constitutional: She is oriented to person, place, and time. She appears well-developed and  well-nourished. No distress.  HENT:  Head: Normocephalic and atraumatic.  Mouth/Throat: Oropharynx is clear and moist.  Eyes: Conjunctivae and EOM are normal. Pupils are equal, round, and reactive to light. No scleral icterus.  Neck: Normal range of motion. Neck supple. No JVD present. No thyromegaly present.  Cardiovascular: Normal rate, regular rhythm and intact distal pulses.  Exam reveals no gallop and no friction rub.   No murmur heard. Respiratory: Effort normal and breath sounds normal. No respiratory distress. She has no wheezes. She has no rales.  GI: Soft. Bowel sounds are normal. She exhibits no distension. There is tenderness (Diffuse).  Musculoskeletal: Normal range of motion. She exhibits no edema.  No arthritis, no gout  Lymphadenopathy:    She has no cervical adenopathy.  Neurological: She is alert and oriented to person, place, and time. No cranial nerve deficit.  No dysarthria, no aphasia  Skin: Skin is warm and dry. No rash noted. No erythema.  Psychiatric: She has a normal mood and affect. Her behavior is normal. Judgment and thought content normal.    LABORATORY PANEL:   CBC  Recent Labs Lab 02/12/15 2035  WBC 13.8*  HGB 13.1  HCT 39.0  PLT 282   ------------------------------------------------------------------------------------------------------------------  Chemistries   Recent Labs Lab 02/12/15 2035  NA 138  K 3.1*  CL 97*  CO2 27  GLUCOSE 193*  BUN 21*  CREATININE 0.85  CALCIUM 9.8  AST 25  ALT 21  ALKPHOS 90  BILITOT 0.7   ------------------------------------------------------------------------------------------------------------------  Cardiac Enzymes  Recent Labs Lab 02/12/15 2035  TROPONINI <0.03   ------------------------------------------------------------------------------------------------------------------  RADIOLOGY:  No results found.  EKG:   Orders placed or performed during the hospital encounter of 02/12/15   . ED EKG  . ED EKG    IMPRESSION AND PLAN:  Principal Problem:   Gastroenteritis - unclear etiology at this time. She has a leukocytosis, though this may be due to hemoconcentration from her dehydration. She has a lactic acidosis, see below. C. difficile studies sent. Admit with fluid hydration, when necessary antiemetics, pain control, follow preliminary studies. Active Problems:   Watery diarrhea - C. difficile sent as above.   Lactic acidosis - unclear whether this is early sepsis, or simply dehydration driven. We'll fluid resuscitate, and follow studies as above.   Type 2 diabetes mellitus (HCC) - SSI with corresponding glucose checks every 6 hours while nothing by mouth.   HTN (hypertension) - continue home meds   GERD (gastroesophageal reflux disease) - home dose PPI   Anxiety - home dose anxiolytics  All the records are reviewed and case discussed with ED provider. Management plans discussed with the patient and/or family.  DVT PROPHYLAXIS: SubQ lovenox  GI PROPHYLAXIS: H2 blocker  ADMISSION STATUS: Observation  CODE STATUS: Full Code Status History    This patient does not have a recorded code status. Please follow your organizational policy for patients in this situation.      TOTAL TIME TAKING CARE OF THIS PATIENT: 45 minutes.    Gladyes Kudo FIELDING 02/12/2015,  10:18 PM  [facility]  Office  [phone]  CC: Primary care physician; Emogene Morgan, MD

## 2015-02-12 NOTE — ED Notes (Signed)
Pt comes into the ED via POV c/o nausea and vomiting.  Patient initially came in with her daughter who started vomiting earlier today.

## 2015-02-13 ENCOUNTER — Observation Stay: Payer: Medicaid Other

## 2015-02-13 DIAGNOSIS — E876 Hypokalemia: Secondary | ICD-10-CM | POA: Diagnosis present

## 2015-02-13 DIAGNOSIS — Z9889 Other specified postprocedural states: Secondary | ICD-10-CM | POA: Diagnosis not present

## 2015-02-13 DIAGNOSIS — E119 Type 2 diabetes mellitus without complications: Secondary | ICD-10-CM | POA: Diagnosis present

## 2015-02-13 DIAGNOSIS — E860 Dehydration: Secondary | ICD-10-CM | POA: Diagnosis present

## 2015-02-13 DIAGNOSIS — K219 Gastro-esophageal reflux disease without esophagitis: Secondary | ICD-10-CM | POA: Diagnosis present

## 2015-02-13 DIAGNOSIS — F419 Anxiety disorder, unspecified: Secondary | ICD-10-CM | POA: Diagnosis present

## 2015-02-13 DIAGNOSIS — K529 Noninfective gastroenteritis and colitis, unspecified: Secondary | ICD-10-CM | POA: Diagnosis present

## 2015-02-13 DIAGNOSIS — I10 Essential (primary) hypertension: Secondary | ICD-10-CM | POA: Diagnosis present

## 2015-02-13 DIAGNOSIS — Z87891 Personal history of nicotine dependence: Secondary | ICD-10-CM | POA: Diagnosis not present

## 2015-02-13 DIAGNOSIS — E872 Acidosis: Secondary | ICD-10-CM | POA: Diagnosis present

## 2015-02-13 LAB — GLUCOSE, CAPILLARY
GLUCOSE-CAPILLARY: 126 mg/dL — AB (ref 65–99)
GLUCOSE-CAPILLARY: 179 mg/dL — AB (ref 65–99)
Glucose-Capillary: 136 mg/dL — ABNORMAL HIGH (ref 65–99)
Glucose-Capillary: 145 mg/dL — ABNORMAL HIGH (ref 65–99)
Glucose-Capillary: 178 mg/dL — ABNORMAL HIGH (ref 65–99)

## 2015-02-13 LAB — GASTROINTESTINAL PANEL BY PCR, STOOL (REPLACES STOOL CULTURE)
ASTROVIRUS: NOT DETECTED
Adenovirus F40/41: NOT DETECTED
CRYPTOSPORIDIUM: NOT DETECTED
CYCLOSPORA CAYETANENSIS: NOT DETECTED
Campylobacter species: NOT DETECTED
E. COLI O157: NOT DETECTED
ENTEROTOXIGENIC E COLI (ETEC): NOT DETECTED
Entamoeba histolytica: NOT DETECTED
Enteroaggregative E coli (EAEC): NOT DETECTED
Enteropathogenic E coli (EPEC): NOT DETECTED
Giardia lamblia: NOT DETECTED
Norovirus GI/GII: DETECTED — AB
Plesimonas shigelloides: NOT DETECTED
ROTAVIRUS A: NOT DETECTED
SALMONELLA SPECIES: NOT DETECTED
SAPOVIRUS (I, II, IV, AND V): NOT DETECTED
SHIGA LIKE TOXIN PRODUCING E COLI (STEC): NOT DETECTED
SHIGELLA/ENTEROINVASIVE E COLI (EIEC): NOT DETECTED
VIBRIO SPECIES: NOT DETECTED
Vibrio cholerae: NOT DETECTED
YERSINIA ENTEROCOLITICA: NOT DETECTED

## 2015-02-13 LAB — BASIC METABOLIC PANEL
ANION GAP: 10 (ref 5–15)
BUN: 23 mg/dL — AB (ref 6–20)
CHLORIDE: 102 mmol/L (ref 101–111)
CO2: 25 mmol/L (ref 22–32)
Calcium: 7.9 mg/dL — ABNORMAL LOW (ref 8.9–10.3)
Creatinine, Ser: 0.65 mg/dL (ref 0.44–1.00)
GFR calc Af Amer: >60 mL/min (ref >60)
GFR calc non Af Amer: >60 mL/min (ref >60)
Glucose, Bld: 180 mg/dL — ABNORMAL HIGH (ref 65–99)
POTASSIUM: 3.2 mmol/L — AB (ref 3.5–5.1)
SODIUM: 137 mmol/L (ref 135–145)

## 2015-02-13 LAB — LACTIC ACID, PLASMA
LACTIC ACID, VENOUS: 2.9 mmol/L — AB (ref 0.5–2.0)
LACTIC ACID, VENOUS: 3.5 mmol/L — AB (ref 0.5–2.0)

## 2015-02-13 LAB — CBC
HEMATOCRIT: 33.4 % — AB (ref 35.0–47.0)
HEMOGLOBIN: 11.3 g/dL — AB (ref 12.0–16.0)
MCH: 31 pg (ref 26.0–34.0)
MCHC: 33.7 g/dL (ref 32.0–36.0)
MCV: 91.9 fL (ref 80.0–100.0)
Platelets: 200 10*3/uL (ref 150–440)
RBC: 3.64 MIL/uL — AB (ref 3.80–5.20)
RDW: 14.8 % — AB (ref 11.5–14.5)
WBC: 7.9 10*3/uL (ref 3.6–11.0)

## 2015-02-13 LAB — C DIFFICILE QUICK SCREEN W PCR REFLEX
C DIFFICILE (CDIFF) TOXIN: NEGATIVE
C DIFFICLE (CDIFF) ANTIGEN: NEGATIVE
C Diff interpretation: NEGATIVE

## 2015-02-13 MED ORDER — IOHEXOL 300 MG/ML  SOLN
100.0000 mL | Freq: Once | INTRAMUSCULAR | Status: AC | PRN
  Administered 2015-02-13: 100 mL via INTRAVENOUS

## 2015-02-13 MED ORDER — POTASSIUM CHLORIDE 20 MEQ PO PACK
40.0000 meq | PACK | Freq: Once | ORAL | Status: DC
Start: 2015-02-13 — End: 2015-02-13
  Filled 2015-02-13: qty 2

## 2015-02-13 MED ORDER — LOPERAMIDE HCL 2 MG PO CAPS: 2.0000 mg | ORAL_CAPSULE | Freq: Four times a day (QID) | ORAL | Status: DC | PRN

## 2015-02-13 MED ORDER — IOHEXOL 240 MG/ML SOLN: 25.0000 mL | INTRAMUSCULAR | Status: DC

## 2015-02-13 MED ORDER — SODIUM CHLORIDE 0.9 % IV BOLUS (SEPSIS): 1000.0000 mL | Freq: Once | INTRAVENOUS | Status: DC

## 2015-02-13 MED ORDER — POTASSIUM CHLORIDE CRYS ER 20 MEQ PO TBCR
40.0000 meq | EXTENDED_RELEASE_TABLET | Freq: Once | ORAL | Status: AC
  Administered 2015-02-13: 40 meq via ORAL
  Filled 2015-02-13: qty 2

## 2015-02-13 MED ORDER — HYDROCODONE-ACETAMINOPHEN 5-325 MG PO TABS: 1.0000 | ORAL_TABLET | ORAL | Status: DC | PRN

## 2015-02-13 NOTE — Progress Notes (Signed)
Dr. Anne Hahn notified of positive GI panel. No new orders placed.

## 2015-02-13 NOTE — Progress Notes (Signed)
Pts. Lactic acid is 3.5. Dr. Anne Hahn notified and ordered another bolus for pt. And another lactic acid draw for several more hours.

## 2015-02-13 NOTE — Progress Notes (Signed)
[facility] - [place] at [facility]   PATIENT NAME: Jamie Massey    MR#:  [id number]  DATE OF BIRTH:  1970-08-26  SUBJECTIVE:  CHIEF COMPLAINT:   Chief Complaint  Patient presents with  . Emesis   -Patient and daughter ate outside yesterday and started having nausea vomiting diarrhea. Daughter also admitted in the hospital for similar complaints. -Nausea and vomiting have improved. Still having diarrhea and also abdominal pain. -Stool for C. difficile is negative  REVIEW OF SYSTEMS:  Review of Systems  Constitutional: Negative for fever and chills.  HENT: Negative for ear discharge, ear pain and nosebleeds.   Eyes: Negative for blurred vision and double vision.  Respiratory: Negative for cough, shortness of breath and wheezing.   Cardiovascular: Negative for chest pain and palpitations.  Gastrointestinal: Positive for nausea, abdominal pain and diarrhea. Negative for vomiting and constipation.  Genitourinary: Negative for dysuria and frequency.  Musculoskeletal: Negative for myalgias and back pain.  Neurological: Negative for dizziness, sensory change, speech change, focal weakness, seizures, weakness and headaches.    DRUG ALLERGIES:  No Known Allergies  VITALS:  Blood pressure 118/76, pulse 111, temperature 99.9 F (37.7 C), temperature source Oral, resp. rate 18, height  (1.6 m), weight 106.5 kg (234 lb 12.6 oz), SpO2 93 %.  PHYSICAL EXAMINATION:  Physical Exam  GENERAL:  45 y.o.-year-old patient lying in the bed with no acute distress.  EYES: Pupils equal, round, reactive to light and accommodation. No scleral icterus. Extraocular muscles intact.  HEENT: Head atraumatic, normocephalic. Oropharynx and nasopharynx clear.  NECK:  Supple, no jugular venous distention. No thyroid enlargement, no tenderness.  LUNGS: Normal breath sounds bilaterally, no wheezing, rales,rhonchi or crepitation. No use of accessory muscles of respiration.   CARDIOVASCULAR: S1, S2 normal. No murmurs, rubs, or gallops.  ABDOMEN: Soft, nontender, minimal discomfort on palpation, nondistended. Bowel sounds present. No organomegaly or mass.  EXTREMITIES: No pedal edema, cyanosis, or clubbing.  NEUROLOGIC: Cranial nerves II through XII are intact. Muscle strength 5/5 in all extremities. Sensation intact. Gait not checked.  PSYCHIATRIC: The patient is alert and oriented x 3.  SKIN: No obvious rash, lesion, or ulcer.    LABORATORY PANEL:   CBC  Recent Labs Lab 02/13/15 0349  WBC 7.9  HGB 11.3*  HCT 33.4*  PLT 200   ------------------------------------------------------------------------------------------------------------------  Chemistries   Recent Labs Lab 02/12/15 2035 02/13/15 0349  NA 138 137  K 3.1* 3.2*  CL 97* 102  CO2 27 25  GLUCOSE 193* 180*  BUN 21* 23*  CREATININE 0.85 0.65  CALCIUM 9.8 7.9*  AST 25  --   ALT 21  --   ALKPHOS 90  --   BILITOT 0.7  --    ------------------------------------------------------------------------------------------------------------------  Cardiac Enzymes  Recent Labs Lab 02/12/15 2035  TROPONINI <0.03   ------------------------------------------------------------------------------------------------------------------  RADIOLOGY:  Ct Abdomen Pelvis W Contrast  02/13/2015  CLINICAL DATA:  Acute onset of nausea, vomiting and headache. Diarrhea. Initial encounter. EXAM: CT ABDOMEN AND PELVIS WITH CONTRAST TECHNIQUE: Multidetector CT imaging of the abdomen and pelvis was performed using the standard protocol following bolus administration of intravenous contrast. CONTRAST:  OMNIPAQUE IOHEXOL 300 MG/ML  SOLN COMPARISON:  CT of the abdomen and pelvis performed 09/14/2008 FINDINGS: The visualized lung bases are clear. The liver and spleen are unremarkable in appearance. The previously noted hypodensity within the right hepatic lobe is no longer seen. The gallbladder is within normal  limits. The pancreas and adrenal glands are  unremarkable, aside from calcification at the left adrenal gland, reflecting remote injury. The kidneys are unremarkable in appearance. There is no evidence of hydronephrosis. No renal or ureteral stones are seen. No perinephric stranding is appreciated. 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 No free fluid is identified. The small bowel is unremarkable in appearance. The stomach is within normal limits. No acute vascular abnormalities are seen. 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 The collapsed IVC filter extends into the intrahepatic IVC. The appendix is not definitely characterized; there is no evidence of appendicitis. The colon is unremarkable in appearance. The bladder is decompressed and grossly unremarkable in appearance. The uterus is grossly unremarkable in appearance, with likely nabothian cysts at the cervix. The left ovary is grossly unremarkable. The right ovary is not definitely seen. No inguinal lymphadenopathy is seen. No acute osseous abnormalities are identified. IMPRESSION: 1. No acute abnormality seen to explain the patient's symptoms. 2. 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 This appearance is only minimally changed from 2010. Electronically Signed   By: Roanna Raider M.D.   On: 02/13/2015 03:56    EKG:   Orders placed or performed during the hospital encounter of 02/12/15  . ED EKG  . ED EKG    ASSESSMENT AND PLAN:   45 year old female with past  medical history significant for hypertension, diabetes, GERD presents to the hospital secondary to worsening abdominal pain nausea and vomiting and diarrhea.  #1 acute gastroenteritis-likely viral versus food poisoning. -Stool for C. difficile is negative. Other stool cultures are pending. -Since nausea and vomiting have improved, we'll start liquid diet today -Start Imodium for diarrhea. Continue fluid support.  #2 hypokalemia-being replaced  #3 hypertension-hold hydrochlorothiazide due to hypokalemia  #4 diabetes mellitus-since only on liquid diet, hold metformin. Continue sliding scale insulin  #5 GERD-on Pepcid  #6 DVT prophylaxis-Lovenox    All the records are reviewed and case discussed with Care Management/Social Workerr. Management plans discussed with the patient, family and they are in agreement.  CODE STATUS: Full code  TOTAL TIME TAKING CARE OF THIS PATIENT: 37 minutes.   POSSIBLE D/C TOMORROW, DEPENDING ON CLINICAL CONDITION.   Rocky Gladden M.D on 02/13/2015 at 8:56 AM  Between 7am to 6pm - Pager - [phone]  After 6pm go to [email or web address] - password [email or web address] [facility]  [place] [facility]  Office  [phone]  CC: Primary care physician; Emogene Morgan, MD

## 2015-02-13 NOTE — Care Management Note (Signed)
Case Management Note  Patient Details  Name: Jamie Massey MRN: [id number] Date of Birth: Feb 14, 1970  Subjective/Objective:      46yo uninsured [name] was admitted 02/12/15 per N/V/D. Her teenaged daughter is also admitted at [facility] with the same symptoms. Reportedly, [name] husband is also a patient at [facility] for reasons unknown to this Clinical research associate. PCP=Dr Ngwe Aycock.  Pharmacy=[facility]. [facility]acy is also [facility].  [name] has no home assistive equipment, no home oxygen and no home health services. She drives herself to her appointments.  Anticipate home with no home health needs. Case management will follow for discharge planning.            Action/Plan:   Expected Discharge Date:  02/14/15               Expected Discharge Plan:     In-House Referral:     Discharge planning Services     Post Acute Care Choice:    Choice offered to:     DME Arranged:    DME Agency:     HH Arranged:    HH Agency:     Status of Service:     Medicare Important Message Given:    Date Medicare IM Given:    Medicare IM give by:    Date Additional Medicare IM Given:    Additional Medicare Important Message give by:     If discussed at Long Length of Stay Meetings, dates discussed:    Additional Comments:  Grabiel Schmutz A, RN 02/13/2015, 12:55 PM

## 2015-02-14 LAB — BASIC METABOLIC PANEL
ANION GAP: 8 (ref 5–15)
BUN: 9 mg/dL (ref 6–20)
CO2: 27 mmol/L (ref 22–32)
CREATININE: 0.68 mg/dL (ref 0.44–1.00)
Calcium: 7.4 mg/dL — ABNORMAL LOW (ref 8.9–10.3)
Chloride: 103 mmol/L (ref 101–111)
Glucose, Bld: 171 mg/dL — ABNORMAL HIGH (ref 65–99)
Potassium: 3.2 mmol/L — ABNORMAL LOW (ref 3.5–5.1)
SODIUM: 138 mmol/L (ref 135–145)

## 2015-02-14 MED ORDER — INFLUENZA VAC SPLIT QUAD 0.5 ML IM SUSY: 0.5000 mL | PREFILLED_SYRINGE | INTRAMUSCULAR | Status: DC

## 2015-02-14 MED ORDER — POTASSIUM CHLORIDE CRYS ER 20 MEQ PO TBCR
40.0000 meq | EXTENDED_RELEASE_TABLET | Freq: Once | ORAL | Status: AC
  Administered 2015-02-14: 40 meq via ORAL
  Filled 2015-02-14: qty 2

## 2015-02-14 NOTE — Progress Notes (Signed)
Discharged home. Discharge instructions given; acknowledged understanding. 

## 2015-02-14 NOTE — Discharge Summary (Signed)
[facility] - [place] at [facility]   PATIENT NAME: Jamie Massey    MR#:  [id number]  DATE OF BIRTH:  December 05, 1970  DATE OF ADMISSION:  02/12/2015 ADMITTING PHYSICIAN: Oralia Manis, MD  DATE OF DISCHARGE: 02/14/2015 11:00 AM  PRIMARY CARE PHYSICIAN: AYCOCK, NGWE A, MD    ADMISSION DIAGNOSIS:  Elevated lactic acid level [E87.2] Nausea and vomiting, vomiting of unspecified type [R11.2] Diarrhea, unspecified type [R19.7]  DISCHARGE DIAGNOSIS:  Principal Problem:   Gastroenteritis Active Problems:   Watery diarrhea   Lactic acidosis   Type 2 diabetes mellitus (HCC)   HTN (hypertension)   GERD (gastroesophageal reflux disease)   Anxiety   SECONDARY DIAGNOSIS:   Past Medical History  Diagnosis Date  . Diabetes mellitus without complication (HCC)   . Hypertension   . GERD (gastroesophageal reflux disease)   . Anxiety     HOSPITAL COURSE:   45 year old female with past medical history significant for hypertension, diabetes, GERD presents to the hospital secondary to worsening abdominal pain nausea and vomiting and diarrhea.  #1 acute gastroenteritis- Norovirus gastroenteritis -Stool for C. difficile is negative.  - Improved with supportive treatment.  #2 hypokalemia-replaced  #3 hypertension- restart HCTZ at discharge  #4 diabetes mellitus-metformin restarted at discharge  #5 GERD-on zantac  Discharge home today  DISCHARGE CONDITIONS:   Stable  CONSULTS OBTAINED:   None  DRUG ALLERGIES:  No Known Allergies  DISCHARGE MEDICATIONS:   Discharge Medication List as of 02/14/2015 10:48 AM    CONTINUE these medications which have NOT CHANGED   Details  ALLERGY 10 MG tablet Take 10 mg by mouth daily., Starting 02/09/2015, Until Discontinued, Historical Med    hydrochlorothiazide (HYDRODIURIL) 25 MG tablet Take 25 mg by mouth daily. for high blood pressure, Starting 11/30/2014, Until Discontinued, Historical Med    hydrOXYzine (VISTARIL) 25  MG capsule Take 1 capsule by mouth at bedtime as needed., Starting 02/09/2015, Until Discontinued, Historical Med    metFORMIN (GLUCOPHAGE) 500 MG tablet Take 500 mg by mouth 2 (two) times daily with a meal., Until Discontinued, Historical Med    ranitidine (ZANTAC) 150 MG tablet Take 1 tablet by mouth 2 (two) times daily., Starting 11/30/2014, Until Discontinued, Historical Med         DISCHARGE INSTRUCTIONS:   1. PCP f/u in 1-2 weeks  If you experience worsening of your admission symptoms, develop shortness of breath, life threatening emergency, suicidal or homicidal thoughts you must seek medical attention immediately by calling 911 or calling your MD immediately  if symptoms less severe.  You Must read complete instructions/literature along with all the possible adverse reactions/side effects for all the Medicines you take and that have been prescribed to you. Take any new Medicines after you have completely understood and accept all the possible adverse reactions/side effects.   Please note  You were cared for by a hospitalist during your hospital stay. If you have any questions about your discharge medications or the care you received while you were in the hospital after you are discharged, you can call the unit and asked to speak with the hospitalist on call if the hospitalist that took care of you is not available. Once you are discharged, your primary care physician will handle any further medical issues. Please note that NO REFILLS for any discharge medications will be authorized once you are discharged, as it is imperative that you return to your primary care physician (or establish a relationship with a primary care physician if  you do not have one) for your aftercare needs so that they can reassess your need for medications and monitor your lab values.    Today   CHIEF COMPLAINT:   Chief Complaint  Patient presents with  . Emesis     VITAL SIGNS:  Blood pressure 126/78,  pulse 89, temperature 97.2 F (36.2 C), temperature source Oral, resp. rate 18, height 5\' 3"  (1.6 m), weight 106.5 kg (234 lb 12.6 oz), SpO2 96 %.  I/O:   Intake/Output Summary (Last 24 hours) at 02/14/15 1710 Last data filed at 02/14/15 0900  Gross per 24 hour  Intake    294 ml  Output      0 ml  Net    294 ml    PHYSICAL EXAMINATION:   Physical Exam  GENERAL:  45 y.o.-year-old patient lying in the bed with no acute distress.  EYES: Pupils equal, round, reactive to light and accommodation. No scleral icterus. Extraocular muscles intact.  HEENT: Head atraumatic, normocephalic. Oropharynx and nasopharynx clear.  NECK:  Supple, no jugular venous distention. No thyroid enlargement, no tenderness.  LUNGS: Normal breath sounds bilaterally, no wheezing, rales,rhonchi or crepitation. No use of accessory muscles of respiration.  CARDIOVASCULAR: S1, S2 normal. No murmurs, rubs, or gallops.  ABDOMEN: Soft, non-tender, non-distended. Bowel sounds present. No organomegaly or mass.  EXTREMITIES: No pedal edema, cyanosis, or clubbing.  NEUROLOGIC: Cranial nerves II through XII are intact. Muscle strength 5/5 in all extremities. Sensation intact. Gait not checked.  PSYCHIATRIC: The patient is alert and oriented x 3.  SKIN: No obvious rash, lesion, or ulcer.   DATA REVIEW:   CBC  Recent Labs Lab 02/13/15 0349  WBC 7.9  HGB 11.3*  HCT 33.4*  PLT 200    Chemistries   Recent Labs Lab 02/12/15 2035  02/14/15 0521  NA 138  < > 138  K 3.1*  < > 3.2*  CL 97*  < > 103  CO2 27  < > 27  GLUCOSE 193*  < > 171*  BUN 21*  < > 9  CREATININE 0.85  < > 0.68  CALCIUM 9.8  < > 7.4*  AST 25  --   --   ALT 21  --   --   ALKPHOS 90  --   --   BILITOT 0.7  --   --   < > = values in this interval not displayed.  Cardiac Enzymes  Recent Labs Lab 02/12/15 2035  TROPONINI <0.03    Microbiology Results  Results for orders placed or performed during the hospital encounter of 02/12/15  C  difficile quick scan w PCR reflex     Status: None   Collection Time: 02/13/15  4:17 AM  Result Value Ref Range Status   C Diff antigen NEGATIVE NEGATIVE Final   C Diff toxin NEGATIVE NEGATIVE Final   C Diff interpretation Negative for C. difficile  Final  Gastrointestinal Panel by PCR , Stool     Status: Abnormal   Collection Time: 02/13/15  5:12 PM  Result Value Ref Range Status   Campylobacter species NOT DETECTED NOT DETECTED Final   Plesimonas shigelloides NOT DETECTED NOT DETECTED Final   Salmonella species NOT DETECTED NOT DETECTED Final   Yersinia enterocolitica NOT DETECTED NOT DETECTED Final   Vibrio species NOT DETECTED NOT DETECTED Final   Vibrio cholerae NOT DETECTED NOT DETECTED Final   Enteroaggregative E coli (EAEC) NOT DETECTED NOT DETECTED Final   Enteropathogenic E coli (EPEC) NOT  DETECTED NOT DETECTED Final   Enterotoxigenic E coli (ETEC) NOT DETECTED NOT DETECTED Final   Shiga like toxin producing E coli (STEC) NOT DETECTED NOT DETECTED Final   E. coli O157 NOT DETECTED NOT DETECTED Final   Shigella/Enteroinvasive E coli (EIEC) NOT DETECTED NOT DETECTED Final   Cryptosporidium NOT DETECTED NOT DETECTED Final   Cyclospora cayetanensis NOT DETECTED NOT DETECTED Final   Entamoeba histolytica NOT DETECTED NOT DETECTED Final   Giardia lamblia NOT DETECTED NOT DETECTED Final   Adenovirus F40/41 NOT DETECTED NOT DETECTED Final   Astrovirus NOT DETECTED NOT DETECTED Final   Norovirus GI/GII DETECTED (A) NOT DETECTED Final    Comment: CRITICAL RESULT CALLED TO, READ BACK BY AND VERIFIED WITH: NIKKI SOLOMAN AT 2220 02/13/2015 BY TFK    Rotavirus A NOT DETECTED NOT DETECTED Final   Sapovirus (I, II, IV, and V) NOT DETECTED NOT DETECTED Final    RADIOLOGY:  Ct Abdomen Pelvis W Contrast  02/13/2015  CLINICAL DATA:  Acute onset of nausea, vomiting and headache. Diarrhea. Initial encounter. EXAM: CT ABDOMEN AND PELVIS WITH CONTRAST TECHNIQUE: Multidetector CT imaging of the  abdomen and pelvis was performed using the standard protocol following bolus administration of intravenous contrast. CONTRAST:  OMNIPAQUE IOHEXOL 300 MG/ML  SOLN COMPARISON:  CT of the abdomen and pelvis performed 09/14/2008 FINDINGS: The visualized lung bases are clear. The liver and spleen are unremarkable in appearance. The previously noted hypodensity within the right hepatic lobe is no longer seen. The gallbladder is within normal limits. The pancreas and adrenal glands are unremarkable, aside from calcification at the left adrenal gland, reflecting remote injury. The kidneys are unremarkable in appearance. There is no evidence of hydronephrosis. No renal or ureteral stones are seen. No perinephric stranding is appreciated. 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 No free fluid is identified. The small bowel is unremarkable in appearance. The stomach is within normal limits. No acute vascular abnormalities are seen. 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 The collapsed IVC filter extends into the intrahepatic IVC. The appendix is not definitely characterized; there is no evidence of appendicitis. The colon is unremarkable in appearance. The bladder is decompressed and grossly unremarkable in appearance. The uterus is grossly unremarkable in appearance, with likely nabothian cysts at the cervix. The left ovary is grossly unremarkable. The right ovary is not definitely seen. No inguinal lymphadenopathy is seen. No acute osseous abnormalities are identified. IMPRESSION: 1. No acute abnormality seen to explain the patient's symptoms. 2. 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 This appearance is only minimally changed from 2010. Electronically Signed   By: Roanna Raider M.D.   On: 02/13/2015 03:56    EKG:   Orders placed or performed during the hospital encounter of 02/12/15  . ED EKG  . ED EKG      Management plans discussed with the patient, family and they are in agreement.  CODE STATUS:  Code Status History    Date Active Date Inactive Code Status Order ID Comments User Context   02/12/2015 11:26 PM 02/14/2015  2:19 PM Full Code [id number]  Oralia Manis, MD Inpatient      TOTAL TIME TAKING CARE OF THIS PATIENT: 37 minutes.    Enid Baas M.D on 02/14/2015 at 5:10 PM  Between 7am to 6pm - Pager - [phone]  After 6pm go to [email or web address] - password [email or web address] [facility]  [place] [facility]  Office  [phone]  CC: Primary care physician; Emogene Morgan, MD

## 2015-02-14 NOTE — Care Management (Signed)
Spoke with patient and she states she has all discharge meds and will not need CM assistance. No further RNCM needs. Patient open to [facility].

## 2016-07-10 ENCOUNTER — Emergency Department
Admission: EM | Admit: 2016-07-10 | Discharge: 2016-07-10 | Disposition: A | Discharge status: Home / Self Care | Payer: Medicaid Other | Attending: Emergency Medicine | Admitting: Emergency Medicine

## 2016-07-10 DIAGNOSIS — Z79899 Other long term (current) drug therapy: Secondary | ICD-10-CM | POA: Insufficient documentation

## 2016-07-10 DIAGNOSIS — I10 Essential (primary) hypertension: Secondary | ICD-10-CM | POA: Diagnosis not present

## 2016-07-10 DIAGNOSIS — Z87891 Personal history of nicotine dependence: Secondary | ICD-10-CM | POA: Insufficient documentation

## 2016-07-10 DIAGNOSIS — Z7984 Long term (current) use of oral hypoglycemic drugs: Secondary | ICD-10-CM | POA: Insufficient documentation

## 2016-07-10 DIAGNOSIS — E119 Type 2 diabetes mellitus without complications: Secondary | ICD-10-CM | POA: Diagnosis not present

## 2016-07-10 DIAGNOSIS — J020 Streptococcal pharyngitis: Secondary | ICD-10-CM | POA: Insufficient documentation

## 2016-07-10 DIAGNOSIS — R070 Pain in throat: Secondary | ICD-10-CM | POA: Diagnosis present

## 2016-07-10 LAB — POCT RAPID STREP A: Streptococcus, Group A Screen (Direct): POSITIVE — AB

## 2016-07-10 MED ORDER — DEXAMETHASONE SODIUM PHOSPHATE 10 MG/ML IJ SOLN
10.0000 mg | Freq: Once | INTRAMUSCULAR | Status: AC
  Administered 2016-07-10: 10 mg via INTRAMUSCULAR
  Filled 2016-07-10: qty 1

## 2016-07-10 MED ORDER — AMOXICILLIN-POT CLAVULANATE 875-125 MG PO TABS: 1.0000 | ORAL_TABLET | Freq: Two times a day (BID) | ORAL | 0 refills | Status: AC

## 2016-07-10 NOTE — ED Triage Notes (Signed)
Pt c/o sore throat with bodyaches for the past week.

## 2016-07-10 NOTE — ED Notes (Signed)
Pt with exposure to strep has white pustules, red, swollen throat

## 2016-07-10 NOTE — ED Provider Notes (Signed)
[facility] Emergency Department Provider Note  ____________________________________________  Time seen: Approximately 4:44 PM  I have reviewed the triage vital signs and the nursing notes.   HISTORY  Chief Complaint Sore Throat    HPI Jamie Massey is a 46 y.o. female presents to the emergency department with pharyngitis with tonsillar exudate, headache and abdominal discomfort for the past "several days". He shouldn't think she has had fever but has not evaluated her temperature at home. Patient has also had nasal congestion and maxillary sinus tenderness. Patient states that congestion is disturbing her sleep. She denies associated chest pain, chest tightness, shortness of breath, nausea, vomiting and abdominal pain. She is speaking in complete sentences and managing her own secretions. No alleviating measures to been attempted.   Past Medical History:  Diagnosis Date  . Anxiety   . Diabetes mellitus without complication (HCC)   . GERD (gastroesophageal reflux disease)   . Hypertension     Patient Active Problem List   Diagnosis Date Noted  . Watery diarrhea 02/12/2015  . Lactic acidosis 02/12/2015  . Gastroenteritis 02/12/2015  . Type 2 diabetes mellitus (HCC) 02/12/2015  . HTN (hypertension) 02/12/2015  . GERD (gastroesophageal reflux disease) 02/12/2015  . Anxiety 02/12/2015    Past Surgical History:  Procedure Laterality Date  . BREAST SURGERY    . EXPLORATORY LAPAROTOMY    . NASAL SINUS SURGERY    . OOPHORECTOMY      Prior to Admission medications   Medication Sig Start Date End Date Taking? Authorizing Provider  ALLERGY 10 MG tablet Take 10 mg by mouth daily. 02/09/15   [provider]  amoxicillin-clavulanate (AUGMENTIN) 875-125 MG tablet Take 1 tablet by mouth 2 (two) times daily. 07/10/16 07/20/16  Orvil Feil, PA-C  hydrochlorothiazide (HYDRODIURIL) 25 MG tablet Take 25 mg by mouth daily. for high blood pressure 11/30/14    [provider]  hydrOXYzine (VISTARIL) 25 MG capsule Take 1 capsule by mouth at bedtime as needed. 02/09/15   [provider]  metFORMIN (GLUCOPHAGE) 500 MG tablet Take 500 mg by mouth 2 (two) times daily with a meal.    [provider]  ranitidine (ZANTAC) 150 MG tablet Take 1 tablet by mouth 2 (two) times daily. 11/30/14   [provider]    Allergies Patient has no known allergies.  Family History  Problem Relation Age of Onset  . Deep vein thrombosis Unknown     Social History Social History  Substance Use Topics  . Smoking status: Former Games developer  . Smokeless tobacco: Never Used  . Alcohol use No     Review of Systems  Constitutional: No fever/chills Eyes: No visual changes. No discharge ENT: Patient has pharyngitis and nasal congestion. Cardiovascular: no chest pain. Respiratory: no cough. No SOB. Gastrointestinal: She has abdominal discomfort. Musculoskeletal: Negative for musculoskeletal pain. Skin: Negative for rash, abrasions, lacerations, ecchymosis. Neurological: Patient has headache, no focal weakness or numbness.   ____________________________________________   PHYSICAL EXAM:  VITAL SIGNS: ED Triage Vitals  Enc Vitals Group     BP 07/10/16 1617 131/80     Pulse Rate 07/10/16 1617 (!) 110     Resp 07/10/16 1617 20     Temp 07/10/16 1617 97.8 F (36.6 C)     Temp Source 07/10/16 1617 Oral     SpO2 07/10/16 1617 99 %     Weight 07/10/16 1617 235 lb (106.6 kg)     Height 07/10/16 1617 5\' 1"  (1.549 m)  Head Circumference --      Peak Flow --      Pain Score 07/10/16 1613 8     Pain Loc --      Pain Edu? --      Excl. in GC? --      Constitutional: Alert and oriented. Well appearing and in no acute distress. Eyes: Conjunctivae are normal. PERRL. EOMI. Head: Atraumatic. Patient has maxillary sinus tenderness. ENT:      Nose: Turbinates are edematous.      Mouth/Throat: Posterior pharynx is erythematous with  tonsillar exudate. Uvula is midline. Hematological/Lymphatic/Immunilogical: Palpable cervical lymphadenopathy Cardiovascular: Normal rate, regular rhythm. Normal S1 and S2.  Good peripheral circulation. Respiratory: Normal respiratory effort without tachypnea or retractions. Lungs CTAB. Good air entry to the bases with no decreased or absent breath sounds. Gastrointestinal: Bowel sounds 4 quadrants. Soft and nontender to palpation. No guarding or rigidity. No palpable masses. No distention. No CVA tenderness. Musculoskeletal: Full range of motion to all extremities. No gross deformities appreciated. Neurologic:  Normal speech and language. No gross focal neurologic deficits are appreciated.  Skin:  Skin is warm, dry and intact. No rash noted. Psychiatric: Mood and affect are normal. Speech and behavior are normal. Patient exhibits appropriate insight and judgement.   ____________________________________________   LABS (all labs ordered are listed, but only abnormal results are displayed)  Labs Reviewed  POCT RAPID STREP A - Abnormal; Notable for the following:       Result Value   Streptococcus, Group A Screen (Direct) POSITIVE (*)    All other components within normal limits   ____________________________________________  EKG   ____________________________________________  RADIOLOGY   No results found.  ____________________________________________    PROCEDURES  Procedure(s) performed:    Procedures    Medications - No data to display   ____________________________________________   INITIAL IMPRESSION / ASSESSMENT AND PLAN / ED COURSE  Pertinent labs & imaging results that were available during my care of the patient were reviewed by me and considered in my medical decision making (see chart for details).  Review of the [place] CSRS was performed in accordance of the [facility] prior to dispensing any controlled drugs.    Assessment and  plan: Streptococcal Pharyngitis Sinusitis  Patient presents to the emergency department with pharyngitis, tonsillar exudate, palpable lymphadenopathy and absence of cough. As patient has nasal congestion and maxillary sinus tenderness, I also suspect sinusitis. Patient's diagnosis is consistent with streptococcal pharyngitis. Patient will be discharged home with prescriptions for Augmentin. Patient is to follow up with primary care as needed or otherwise directed. Patient is given ED precautions to return to the ED for any worsening or new symptoms. All patient questions were answered.   ____________________________________________  FINAL CLINICAL IMPRESSION(S) / ED DIAGNOSES  Final diagnoses:  Streptococcal pharyngitis      NEW MEDICATIONS STARTED DURING THIS VISIT:  New Prescriptions   AMOXICILLIN-CLAVULANATE (AUGMENTIN) 875-125 MG TABLET    Take 1 tablet by mouth 2 (two) times daily.        This chart was dictated using voice recognition software/Dragon. Despite best efforts to proofread, errors can occur which can change the meaning. Any change was purely unintentional.    Orvil FeilWoods, Jaclyn M, PA-C 07/10/16 1652    Sharyn CreamerQuale, Mark, MD 07/11/16 [phone]

## 2016-11-10 IMAGING — CT CT ABD-PELV W/ CM
1 of 3 series · 13 of 32 positions shown, 18 images · IV contrast (omnipaque)
Comparison: CT of the abdomen and pelvis performed 09/14/2008

CLINICAL DATA: Acute onset of nausea, vomiting and headache.
Diarrhea. Initial encounter.

EXAM:
CT ABDOMEN AND PELVIS WITH CONTRAST
TECHNIQUE: Multidetector CT imaging of the abdomen and pelvis was performed
using the standard protocol following bolus administration of
intravenous contrast.
CONTRAST:  100mL OMNIPAQUE IOHEXOL 300 MG/ML  SOLN

[Series 2: routine abd pel with · axial · 0.93mm/px · z∈[-500,-50]mm · 13 of 102 slices shown, 18 images]
[im 6/102  soft-tissue]
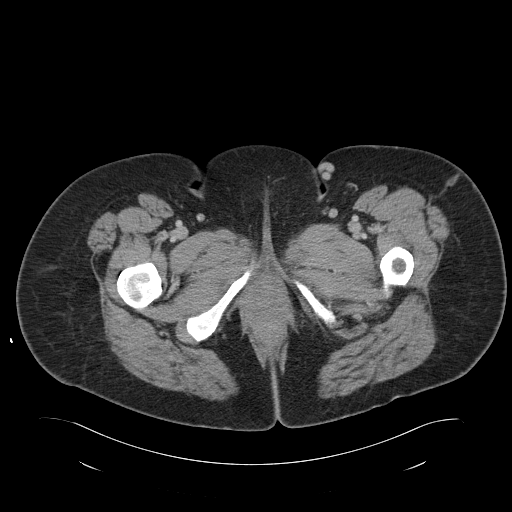
[im 6/102  bone]
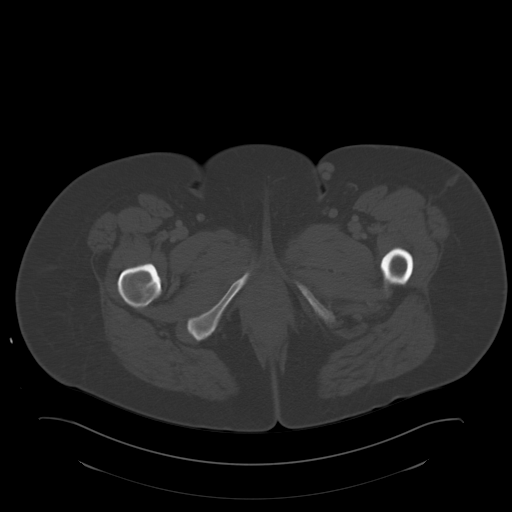
[im 17/102  soft-tissue]
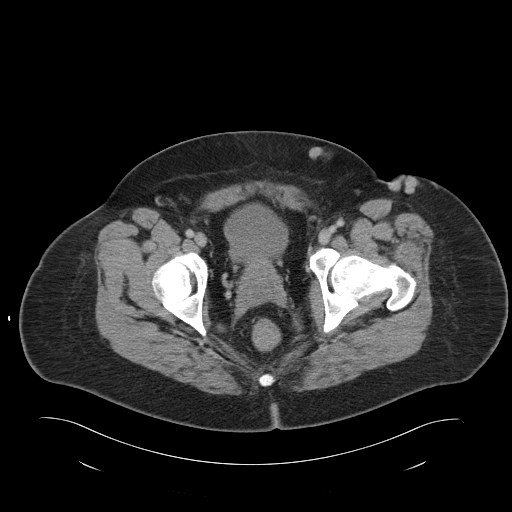
[im 23/102  soft-tissue]
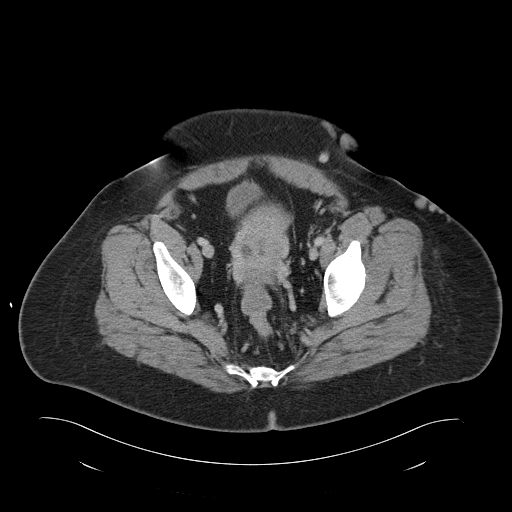
[im 29/102  soft-tissue]
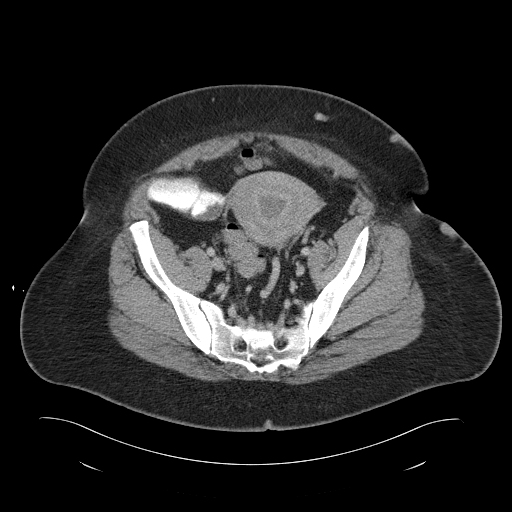
[im 40/102  soft-tissue]
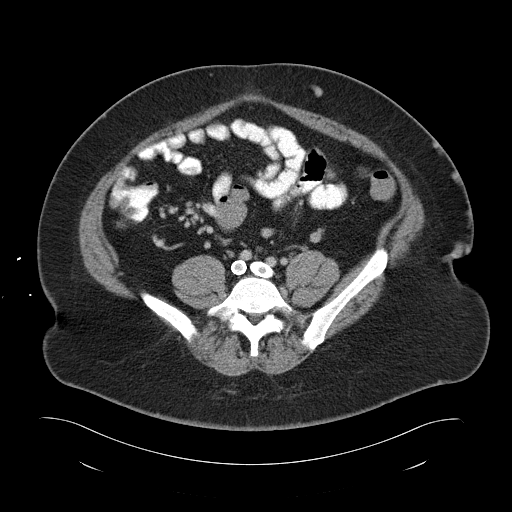
[im 45/102  soft-tissue]
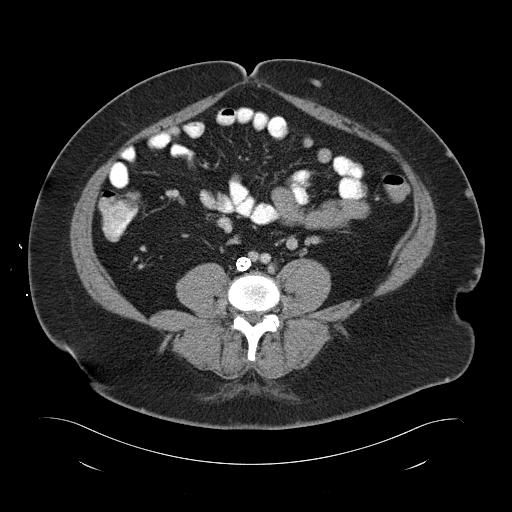
[im 57/102  soft-tissue]
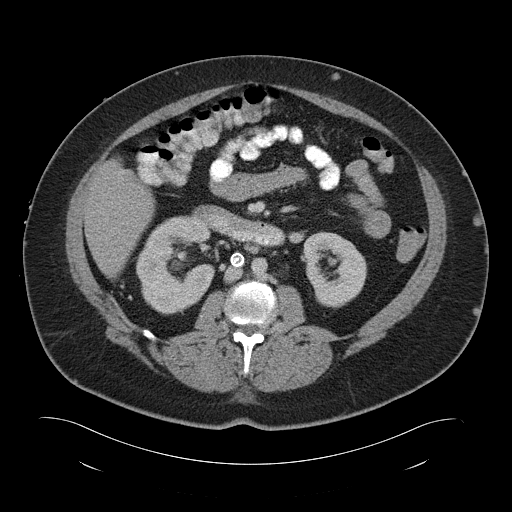
[im 62/102  soft-tissue]
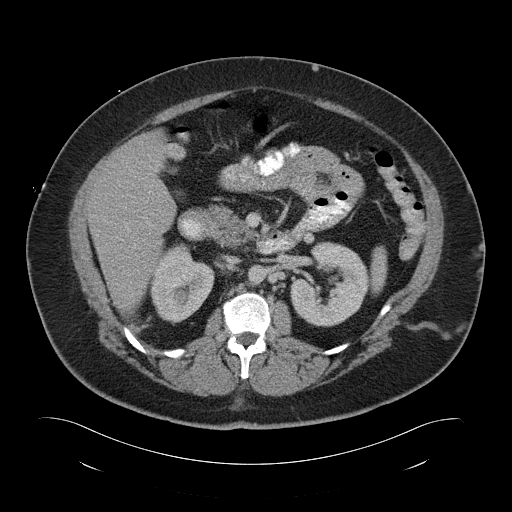
[im 73/102  soft-tissue]
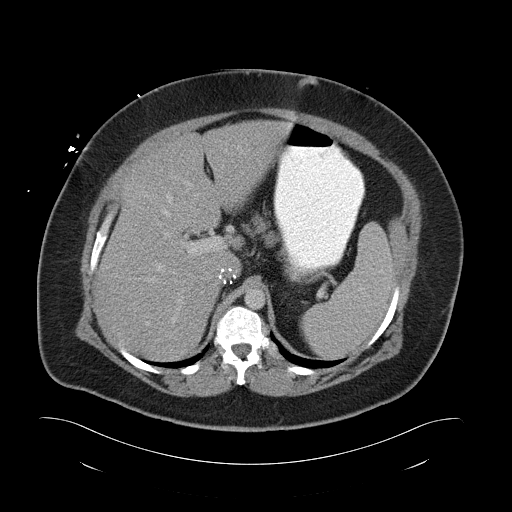
[im 73/102  bone]
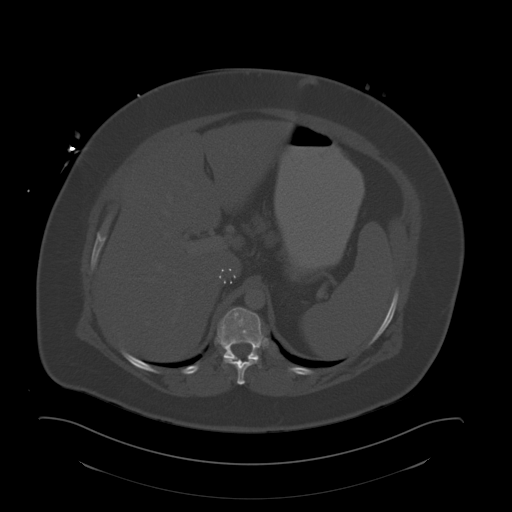
[im 79/102  soft-tissue]
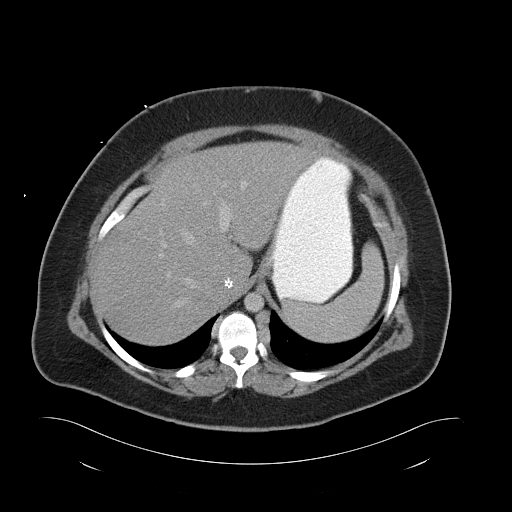
[im 79/102  lung]
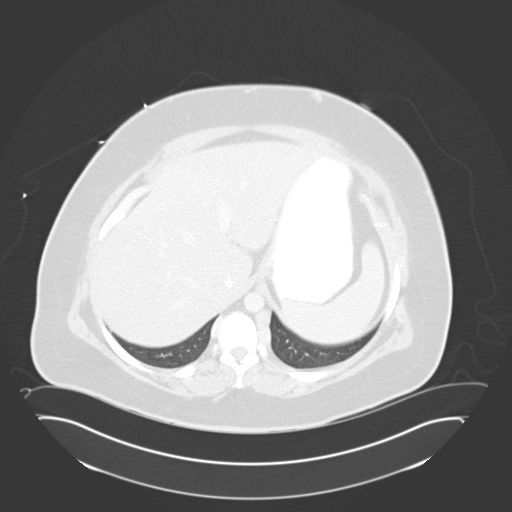
[im 85/102  soft-tissue]
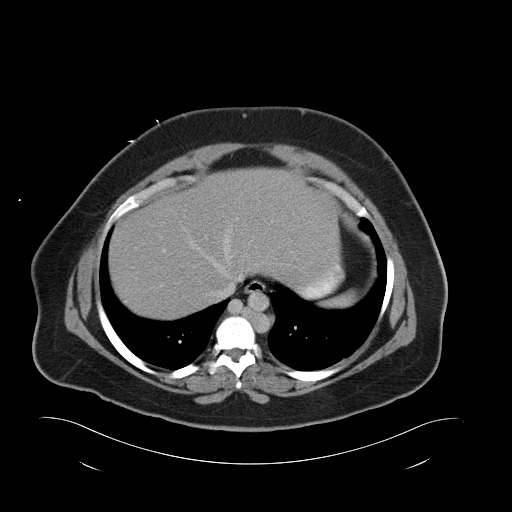
[im 85/102  lung]
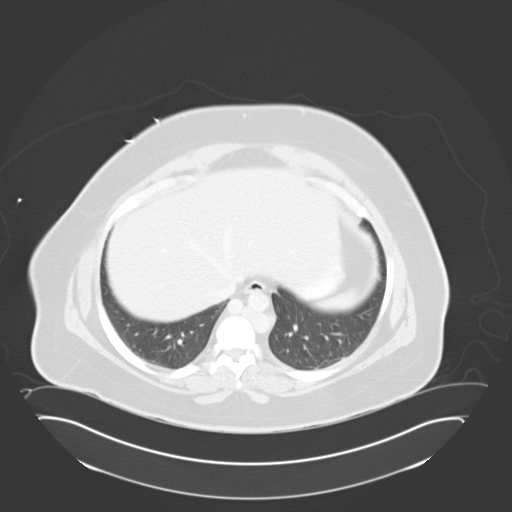
[im 90/102  lung]
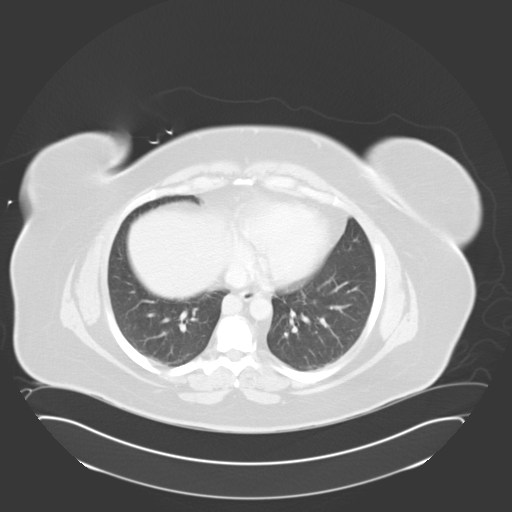
[im 96/102  soft-tissue]
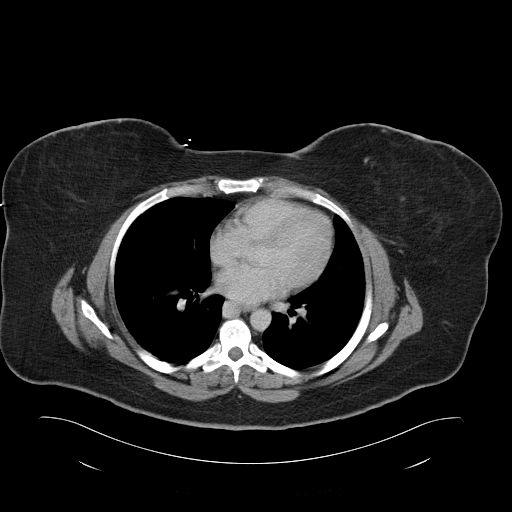
[im 96/102  lung]
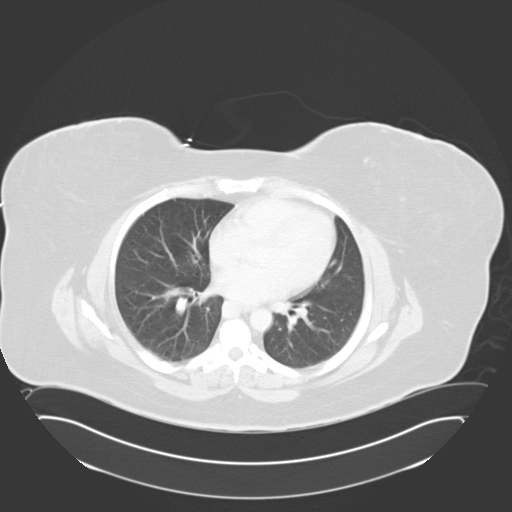

[13 of 32 positions shown; findings below may reference images not displayed]

FINDINGS: The visualized lung bases are clear.

The liver and spleen are unremarkable in appearance. The previously
noted hypodensity within the right hepatic lobe is no longer seen.
The gallbladder is within normal limits. The pancreas and adrenal
glands are unremarkable, aside from calcification at the left
adrenal gland, reflecting remote injury.

The kidneys are unremarkable in appearance. There is no evidence of
hydronephrosis. No renal or ureteral stones are seen. No perinephric
stranding is appreciated.

Prominent superficial varices noted along the left anterior
abdominal wall, and a few retroperitoneal collateral vessels are
also seen.

No free fluid is identified. The small bowel is unremarkable in
appearance. The stomach is within normal limits. No acute vascular
abnormalities are seen.

There appears to be chronic thrombosis of the inferior vena cava,
with a vascular stent along the common iliac veins and lower
inferior vena cava, and chronic collapse of the inferior vena cava
at and about the level of the IVC filter. The collapsed IVC filter
extends into the intrahepatic IVC.

The appendix is not definitely characterized; there is no evidence
of appendicitis. The colon is unremarkable in appearance.

The bladder is decompressed and grossly unremarkable in appearance.
The uterus is grossly unremarkable in appearance, with likely
nabothian cysts at the cervix. The left ovary is grossly
unremarkable. The right ovary is not definitely seen. No inguinal
lymphadenopathy is seen.

No acute osseous abnormalities are identified.
IMPRESSION: 1. No acute abnormality seen to explain the patient's symptoms.
2. Chronic thrombosis and collapse of the inferior vena cava, with
underlying vascular stent and IVC filter. Associated prominent
superficial varices along the left anterior abdominal wall, and
retroperitoneal collateral vessels also noted. This appearance is
only minimally changed from 8242.

## 2017-02-17 ENCOUNTER — Telehealth: Payer: Self-pay | Admitting: Pharmacy Technician

## 2017-02-17 ENCOUNTER — Ambulatory Visit: Payer: Self-pay

## 2017-02-17 NOTE — Telephone Encounter (Signed)
Patient failed to show for eligibility appointment scheduled for 02/17/17 at 2:00pm.  Patient stated that she forgot about appointment.  Verbally communicated financial information patient is to bring to appointment.  Eligibility appointment rescheduled for 03/06/17 at 3:00p.m.  Pt to complete MTM on 03/06/17 at 2:00p.m.  Sherilyn DacostaBetty J. Massey Care Manager [facility]

## 2017-03-04 ENCOUNTER — Other Ambulatory Visit: Payer: Self-pay | Admitting: Family Medicine

## 2017-03-04 DIAGNOSIS — Z1239 Encounter for other screening for malignant neoplasm of breast: Secondary | ICD-10-CM

## 2017-03-06 ENCOUNTER — Ambulatory Visit: Payer: Medicaid Other

## 2017-03-06 DIAGNOSIS — M189 Osteoarthritis of first carpometacarpal joint, unspecified: Secondary | ICD-10-CM | POA: Insufficient documentation

## 2017-04-04 ENCOUNTER — Telehealth: Payer: Self-pay | Admitting: Pharmacy Technician

## 2017-04-04 NOTE — Telephone Encounter (Signed)
Patient stated that she is no longer interested in our program.  Has other means to obtain her medications.  Jamie DacostaBetty J. Massey [place] Care Manager [facility]

## 2017-05-28 ENCOUNTER — Ambulatory Visit: Payer: Self-pay

## 2017-07-23 ENCOUNTER — Encounter: Payer: Self-pay | Admitting: *Deleted

## 2017-07-23 ENCOUNTER — Encounter (INDEPENDENT_AMBULATORY_CARE_PROVIDER_SITE_OTHER): Payer: Self-pay

## 2017-07-23 ENCOUNTER — Other Ambulatory Visit: Payer: Self-pay

## 2017-07-23 ENCOUNTER — Ambulatory Visit
Admission: RE | Admit: 2017-07-23 | Discharge: 2017-07-23 | Disposition: A | Discharge status: Home / Self Care | Payer: Medicaid Other | Source: Ambulatory Visit | Attending: Oncology | Admitting: Oncology

## 2017-07-23 ENCOUNTER — Ambulatory Visit: Payer: Self-pay | Attending: Oncology | Admitting: *Deleted

## 2017-07-23 VITALS — BP 130/85 | HR 93 | Temp 97.5°F | Ht 63.0 in | Wt 216.0 lb

## 2017-07-23 DIAGNOSIS — Z0000 Encounter for general adult medical examination without abnormal findings: Secondary | ICD-10-CM

## 2017-07-23 NOTE — Progress Notes (Signed)
  Subjective:     Patient ID: Mosetta AnisMary A Amis, female   DOB: 10/28/1970, 47 y.o.   MRN: [id number]  HPI   Review of Systems     Objective:   Physical Exam  Pulmonary/Chest: Right breast exhibits no inverted nipple, no mass, no nipple discharge, no skin change and no tenderness. Left breast exhibits no inverted nipple, no mass, no nipple discharge, no skin change and no tenderness.  Large pendulous breast noted       Assessment:     47 [facility].  Significant family history of breast and ovarian cancer.  Her sister was diagnosed in her late 620's and died at age 49 from metastatic breast cancer, maternal aunt had breast cancer in her 3960's and a paternal grandmother with ovarian cancer.  Clinical breast exam unremarkable.  Taught self breast awareness.  Last pap on 04/24/17 was negative without HPV co-testing.  Patient has been screened for eligibility.  She does not have any insurance, Medicare or Medicaid.  She also meets financial eligibility.  Hand-out given on the Affordable Care Act. Risk Assessment    Risk Scores      07/23/2017   Last edited by: Scarlett PrestoShaver, Anne F, RN   5-year risk: 2.3 %   Lifetime risk: 19.9 %            Plan:        Screening mammogram ordered.  Will discuss possibility of genetic testing and oncology consult for high risk of breast cancer after results of her mammogram.  Will follow-up per [facility] protocol.

## 2017-07-23 NOTE — Patient Instructions (Signed)
Gave patient hand-out, Women Staying Healthy, Active and Well from [facility], with education on breast health, pap smears, heart and colon health. 

## 2017-07-23 NOTE — Progress Notes (Signed)
Letter mailed from the [facility] to inform patient of her normal mammogram results.  Patient is to follow-up with annual screening in one year.  HSIS to Christy. 

## 2018-11-11 ENCOUNTER — Encounter: Payer: Self-pay | Admitting: Emergency Medicine

## 2018-11-11 ENCOUNTER — Emergency Department
Admission: EM | Admit: 2018-11-11 | Discharge: 2018-11-11 | Disposition: A | Discharge status: Home / Self Care | Payer: Medicaid Other | Attending: Student in an Organized Health Care Education/Training Program | Admitting: Student in an Organized Health Care Education/Training Program

## 2018-11-11 ENCOUNTER — Other Ambulatory Visit: Payer: Self-pay

## 2018-11-11 DIAGNOSIS — Z79899 Other long term (current) drug therapy: Secondary | ICD-10-CM | POA: Insufficient documentation

## 2018-11-11 DIAGNOSIS — I10 Essential (primary) hypertension: Secondary | ICD-10-CM | POA: Insufficient documentation

## 2018-11-11 DIAGNOSIS — Z7984 Long term (current) use of oral hypoglycemic drugs: Secondary | ICD-10-CM | POA: Insufficient documentation

## 2018-11-11 DIAGNOSIS — Z87891 Personal history of nicotine dependence: Secondary | ICD-10-CM | POA: Insufficient documentation

## 2018-11-11 DIAGNOSIS — N898 Other specified noninflammatory disorders of vagina: Secondary | ICD-10-CM

## 2018-11-11 DIAGNOSIS — L292 Pruritus vulvae: Secondary | ICD-10-CM | POA: Insufficient documentation

## 2018-11-11 DIAGNOSIS — E119 Type 2 diabetes mellitus without complications: Secondary | ICD-10-CM | POA: Insufficient documentation

## 2018-11-11 LAB — COMPREHENSIVE METABOLIC PANEL
ALT: 20 U/L (ref 0–44)
AST: 22 U/L (ref 15–41)
Albumin: 4.1 g/dL (ref 3.5–5.0)
Alkaline Phosphatase: 94 U/L (ref 38–126)
Anion gap: 11 (ref 5–15)
BUN: 15 mg/dL (ref 6–20)
CO2: 29 mmol/L (ref 22–32)
Calcium: 9.5 mg/dL (ref 8.9–10.3)
Chloride: 102 mmol/L (ref 98–111)
Creatinine, Ser: 0.95 mg/dL (ref 0.44–1.00)
GFR calc Af Amer: >60 mL/min (ref >60)
GFR calc non Af Amer: >60 mL/min (ref >60)
Glucose, Bld: 129 mg/dL — ABNORMAL HIGH (ref 70–99)
Potassium: 3.7 mmol/L (ref 3.5–5.1)
Sodium: 142 mmol/L (ref 135–145)
Total Bilirubin: 0.6 mg/dL (ref 0.3–1.2)
Total Protein: 7.5 g/dL (ref 6.5–8.1)

## 2018-11-11 LAB — CBC WITH DIFFERENTIAL/PLATELET
Abs Immature Granulocytes: 0.04 10*3/uL (ref 0.00–0.07)
Basophils Absolute: 0 10*3/uL (ref 0.0–0.1)
Basophils Relative: 1 %
Eosinophils Absolute: 0.2 10*3/uL (ref 0.0–0.5)
Eosinophils Relative: 2 %
HCT: 33.3 % — ABNORMAL LOW (ref 36.0–46.0)
Hemoglobin: 10.3 g/dL — ABNORMAL LOW (ref 12.0–15.0)
Immature Granulocytes: 1 %
Lymphocytes Relative: 16 %
Lymphs Abs: 1.3 10*3/uL (ref 0.7–4.0)
MCH: 27.8 pg (ref 26.0–34.0)
MCHC: 30.9 g/dL (ref 30.0–36.0)
MCV: 90 fL (ref 80.0–100.0)
Monocytes Absolute: 0.4 10*3/uL (ref 0.1–1.0)
Monocytes Relative: 6 %
Neutro Abs: 5.9 10*3/uL (ref 1.7–7.7)
Neutrophils Relative %: 74 %
Platelets: 287 10*3/uL (ref 150–400)
RBC: 3.7 MIL/uL — ABNORMAL LOW (ref 3.87–5.11)
RDW: 15.7 % — ABNORMAL HIGH (ref 11.5–15.5)
WBC: 7.9 10*3/uL (ref 4.0–10.5)
nRBC: 0 % (ref 0.0–0.2)

## 2018-11-11 LAB — URINALYSIS, COMPLETE (UACMP) WITH MICROSCOPIC
Bacteria, UA: NONE SEEN
Bilirubin Urine: NEGATIVE
Glucose, UA: NEGATIVE mg/dL
Hgb urine dipstick: NEGATIVE
Ketones, ur: NEGATIVE mg/dL
Leukocytes,Ua: NEGATIVE
Nitrite: NEGATIVE
Protein, ur: NEGATIVE mg/dL
Specific Gravity, Urine: 1.009 (ref 1.005–1.030)
WBC, UA: NONE SEEN WBC/hpf (ref 0–5)
pH: 6 (ref 5.0–8.0)

## 2018-11-11 LAB — GLUCOSE, CAPILLARY: Glucose-Capillary: 189 mg/dL — ABNORMAL HIGH (ref 70–99)

## 2018-11-11 LAB — WET PREP, GENITAL
Clue Cells Wet Prep HPF POC: NONE SEEN
Sperm: NONE SEEN
Trich, Wet Prep: NONE SEEN
WBC, Wet Prep HPF POC: NONE SEEN
Yeast Wet Prep HPF POC: NONE SEEN

## 2018-11-11 MED ORDER — FLUCONAZOLE 150 MG PO TABS: 150.0000 mg | ORAL_TABLET | Freq: Once | ORAL | 0 refills | Status: DC

## 2018-11-11 MED ORDER — FLUCONAZOLE 150 MG PO TABS: 150.0000 mg | ORAL_TABLET | Freq: Once | ORAL | 0 refills | Status: AC

## 2018-11-11 NOTE — Discharge Instructions (Signed)
Please follow up with PCP.  I have given you a referral to neurology regarding your intermittent tremor.  Please return if you develop any additional symptoms, questions or concerns.

## 2018-11-11 NOTE — ED Notes (Signed)
Says she has tremors today.  Says this has happened before.  Says she has been having yeast infections--her doctor gives her diflucan and then it goes away and comes back in 2 weeks.  Says this time she is also itching under her b reasts and her rectum.

## 2018-11-11 NOTE — ED Provider Notes (Signed)
[facility] Emergency Department Provider Note    First MD Initiated Contact with Patient 11/11/18 1339     (approximate)  I have reviewed the triage vital signs and the nursing notes.   HISTORY  Chief Complaint Tremors    HPI Jamie Massey is a 48 y.o. female below his past medical history presents the ER for several weeks progressively worsening vaginal itching and discomfort.  Also concerned that her blood sugars not well controlled.  States that she is becoming frustrated because she is had multiple episodes of yeast infections over the past several months.  Was recently started on antibiotics for UTI.  Denies any nausea or vomiting.  No fevers.  Denies any pelvic pain.  No recent Pap or pelvic exam.   She also states that she will intermittently develop a tremor of the right arm.  This is been going on for years.  Patient uncertain as to what precipitates this.  Denies any numbness or tingling.  No headache.  No blurry vision.   Past Medical History:  Diagnosis Date  . Anxiety   . Diabetes mellitus without complication (HCC)   . GERD (gastroesophageal reflux disease)   . Hypertension    Family History  Problem Relation Age of Onset  . Deep vein thrombosis Other   . Breast cancer Sister 83  . Breast cancer Maternal Aunt 60  . Ovarian cancer Paternal Grandmother    Past Surgical History:  Procedure Laterality Date  . BREAST EXCISIONAL BIOPSY Right 1999   neg  . BREAST SURGERY    . EXPLORATORY LAPAROTOMY    . NASAL SINUS SURGERY    . OOPHORECTOMY     Patient Active Problem List   Diagnosis Date Noted  . Watery diarrhea 02/12/2015  . Lactic acidosis 02/12/2015  . Gastroenteritis 02/12/2015  . Type 2 diabetes mellitus (HCC) 02/12/2015  . HTN (hypertension) 02/12/2015  . GERD (gastroesophageal reflux disease) 02/12/2015  . Anxiety 02/12/2015      Prior to Admission medications   Medication Sig Start Date End Date Taking? Authorizing  Provider  hydrochlorothiazide (HYDRODIURIL) 25 MG tablet Take 25 mg by mouth daily. for high blood pressure 11/30/14  Yes [provider]  ALLERGY 10 MG tablet Take 10 mg by mouth daily. 02/09/15   [provider]  fluconazole (DIFLUCAN) 150 MG tablet Take 1 tablet (150 mg total) by mouth once for 1 dose. 11/11/18 11/11/18  Willy Eddy, MD  hydrOXYzine (VISTARIL) 25 MG capsule Take 1 capsule by mouth at bedtime as needed. 02/09/15   [provider]  metFORMIN (GLUCOPHAGE) 500 MG tablet Take 500 mg by mouth 2 (two) times daily with a meal.    [provider]  ranitidine (ZANTAC) 150 MG tablet Take 1 tablet by mouth 2 (two) times daily. 11/30/14   [provider]    Allergies Patient has no known allergies.    Social History Social History   Tobacco Use  . Smoking status: Former Games developer  . Smokeless tobacco: Never Used  Substance Use Topics  . Alcohol use: No  . Drug use: No    Review of Systems Patient denies headaches, rhinorrhea, blurry vision, numbness, shortness of breath, chest pain, edema, cough, abdominal pain, nausea, vomiting, diarrhea, dysuria, fevers, rashes or hallucinations unless otherwise stated above in HPI. ____________________________________________   PHYSICAL EXAM:  VITAL SIGNS: Vitals:   11/11/18 1130  BP: (!) 145/79  Pulse: 94  Resp: 16  Temp: 98.7 F (37.1 C)  SpO2: 99%    Constitutional: Alert and oriented.  Eyes: Conjunctivae are normal.  Head: Atraumatic. Nose: No congestion/rhinnorhea. Mouth/Throat: Mucous membranes are moist.   Neck: No stridor. Painless ROM.  Cardiovascular: Normal rate, regular rhythm. Grossly normal heart sounds.  Good peripheral circulation. Respiratory: Normal respiratory effort.  No retractions. Lungs CTAB. Gastrointestinal: Soft and nontender. No distention. No abdominal bruits. No CVA tenderness. Genitourinary: declined Musculoskeletal: No lower extremity tenderness nor  edema.  No joint effusions. Neurologic:  Normal speech and language. No gross focal neurologic deficits are appreciated. No facial droop.  Moving all extremities.  Good strength throughout.  Ambulate steady gait.  Does have intermittent distractible resting tremor of the right upper extremity Skin:  Skin is warm, dry and intact. No rash noted. Psychiatric: Mood and affect are normal. Speech and behavior are normal.  ____________________________________________   LABS (all labs ordered are listed, but only abnormal results are displayed)  Results for orders placed or performed during the hospital encounter of 11/11/18 (from the past 24 hour(s))  Glucose, capillary     Status: Abnormal   Collection Time: 11/11/18 11:32 AM  Result Value Ref Range   Glucose-Capillary 189 (H) 70 - 99 mg/dL   Comment 1 Notify RN   CBC with Differential/Platelet     Status: Abnormal   Collection Time: 11/11/18  2:02 PM  Result Value Ref Range   WBC 7.9 4.0 - 10.5 K/uL   RBC 3.70 (L) 3.87 - 5.11 MIL/uL   Hemoglobin 10.3 (L) 12.0 - 15.0 g/dL   HCT 10.6 (L) 26.9 - 48.5 %   MCV 90.0 80.0 - 100.0 fL   MCH 27.8 26.0 - 34.0 pg   MCHC 30.9 30.0 - 36.0 g/dL   RDW 46.2 (H) 70.3 - 50.0 %   Platelets 287 150 - 400 K/uL   nRBC 0.0 0.0 - 0.2 %   Neutrophils Relative % 74 %   Neutro Abs 5.9 1.7 - 7.7 K/uL   Lymphocytes Relative 16 %   Lymphs Abs 1.3 0.7 - 4.0 K/uL   Monocytes Relative 6 %   Monocytes Absolute 0.4 0.1 - 1.0 K/uL   Eosinophils Relative 2 %   Eosinophils Absolute 0.2 0.0 - 0.5 K/uL   Basophils Relative 1 %   Basophils Absolute 0.0 0.0 - 0.1 K/uL   Immature Granulocytes 1 %   Abs Immature Granulocytes 0.04 0.00 - 0.07 K/uL  Comprehensive metabolic panel     Status: Abnormal   Collection Time: 11/11/18  2:02 PM  Result Value Ref Range   Sodium 142 135 - 145 mmol/L   Potassium 3.7 3.5 - 5.1 mmol/L   Chloride 102 98 - 111 mmol/L   CO2 29 22 - 32 mmol/L   Glucose, Bld 129 (H) 70 - 99 mg/dL   BUN  15 6 - 20 mg/dL   Creatinine, Ser 9.38 0.44 - 1.00 mg/dL   Calcium 9.5 8.9 - 18.2 mg/dL   Total Protein 7.5 6.5 - 8.1 g/dL   Albumin 4.1 3.5 - 5.0 g/dL   AST 22 15 - 41 U/L   ALT 20 0 - 44 U/L   Alkaline Phosphatase 94 38 - 126 U/L   Total Bilirubin 0.6 0.3 - 1.2 mg/dL   GFR calc non Af Amer >60 >60 mL/min   GFR calc Af Amer >60 >60 mL/min   Anion gap 11 5 - 15  Urinalysis, Complete w Microscopic     Status: Abnormal   Collection Time: 11/11/18  2:02  PM  Result Value Ref Range   Color, Urine STRAW (A) YELLOW   APPearance CLEAR (A) CLEAR   Specific Gravity, Urine 1.009 1.005 - 1.030   pH 6.0 5.0 - 8.0   Glucose, UA NEGATIVE NEGATIVE mg/dL   Hgb urine dipstick NEGATIVE NEGATIVE   Bilirubin Urine NEGATIVE NEGATIVE   Ketones, ur NEGATIVE NEGATIVE mg/dL   Protein, ur NEGATIVE NEGATIVE mg/dL   Nitrite NEGATIVE NEGATIVE   Leukocytes,Ua NEGATIVE NEGATIVE   RBC / HPF 0-5 0 - 5 RBC/hpf   WBC, UA NONE SEEN 0 - 5 WBC/hpf   Bacteria, UA NONE SEEN NONE SEEN   Squamous Epithelial / LPF 0-5 0 - 5   Mucus PRESENT   Wet prep, genital     Status: None   Collection Time: 11/11/18  3:00 PM   Specimen: Vaginal  Result Value Ref Range   Yeast Wet Prep HPF POC NONE SEEN NONE SEEN   Trich, Wet Prep NONE SEEN NONE SEEN   Clue Cells Wet Prep HPF POC NONE SEEN NONE SEEN   WBC, Wet Prep HPF POC NONE SEEN NONE SEEN   Sperm NONE SEEN    ____________________________________________ ____________________________________________  RADIOLOGY  I personally reviewed all radiographic images ordered to evaluate for the above acute complaints and reviewed radiology reports and findings.  These findings were personally discussed with the patient.  Please see medical record for radiology report.  ____________________________________________   PROCEDURES  Procedure(s) performed:  Procedures    Critical Care performed: no ____________________________________________   INITIAL IMPRESSION / ASSESSMENT  AND PLAN / ED COURSE  Pertinent labs & imaging results that were available during my care of the patient were reviewed by me and considered in my medical decision making (see chart for details).   DDX: uti, yeast infection, bv, dm, dka, hhns  ADELEIGH BARLETTA is a 48 y.o. who presents to the ED with symptoms as described above.  Patient well-appearing in no acute distress.  Abdominal exam soft and benign.  Recommend pelvic exam and evaluation for above complaints.  Do not feel that CT ultrasound imaging clinically indicated with her current presentation.  Will ensure there is no evidence of diabetic emergency or complication at this time.  She does have a distractible resting tremor that apparently has been going on for quite some time now.  No other neuro deficits.  I think this is appropriate for outpatient work-up.  Clinical Course as of Nov 10 1517  Wed Nov 11, 2018  1458 Patient declining pelvic exam.  States she would prefer to have this performed as an outpatient.  Does agree to self swab for wet prep.  Will treat for yeast infection given her history of similar symptoms.  Have discussed with the patient and available family all diagnostics and treatments performed thus far and all questions were answered to the best of my ability. The patient demonstrates understanding and agreement with plan.    [PR]    Clinical Course User Index [PR] Merlyn Lot, MD    The patient was evaluated in Emergency Department today for the symptoms described in the history of present illness. He/she was evaluated in the context of the global COVID-19 pandemic, which necessitated consideration that the patient might be at risk for infection with the SARS-CoV-2 virus that causes COVID-19. Institutional protocols and algorithms that pertain to the evaluation of patients at risk for COVID-19 are in a state of rapid change based on information released by regulatory bodies including the CDC and  federal and state  organizations. These policies and algorithms were followed during the patient's care in the ED.  As part of my medical decision making, I reviewed the following data within the electronic MEDICAL RECORD NUMBER Nursing notes reviewed and incorporated, Labs reviewed, notes from prior ED visits and [place] Controlled Substance Database   ____________________________________________   FINAL CLINICAL IMPRESSION(S) / ED DIAGNOSES  Final diagnoses:  Vaginal itching      NEW MEDICATIONS STARTED DURING THIS VISIT:  New Prescriptions   FLUCONAZOLE (DIFLUCAN) 150 MG TABLET    Take 1 tablet (150 mg total) by mouth once for 1 dose.     Note:  This document was prepared using Dragon voice recognition software and may include unintentional dictation errors.    Willy Eddyobinson, Omeed Osuna, MD 11/11/18 1520

## 2018-11-11 NOTE — ED Notes (Signed)
Pt c/o shaking in her right arm that started "on and off a while ago" and a "yeast infection that causes itching in lots of places in my body". Pt appears in NAD at this time

## 2018-11-11 NOTE — ED Triage Notes (Signed)
PT states she is having " diabetic problems" , states her PCP states she has " protein in her urine". PT denies her glucose being low or high. PT also states she has " the shakes" PT has tremor to RT hand. PT in NAD . PT states she wants to be seen to change her diflucan for a yeast infection that doesn't seem to be resolving.

## 2019-02-24 LAB — GC/CHLAMYDIA PROBE AMP
Chlamydia trachomatis, NAA: NEGATIVE
Neisseria Gonorrhoeae by PCR: NEGATIVE

## 2019-04-26 ENCOUNTER — Emergency Department
Admission: EM | Admit: 2019-04-26 | Discharge: 2019-04-26 | Disposition: A | Discharge status: Home / Self Care | Payer: Medicaid Other | Attending: Emergency Medicine | Admitting: Emergency Medicine

## 2019-04-26 ENCOUNTER — Other Ambulatory Visit: Payer: Self-pay

## 2019-04-26 DIAGNOSIS — Z87891 Personal history of nicotine dependence: Secondary | ICD-10-CM | POA: Insufficient documentation

## 2019-04-26 DIAGNOSIS — E119 Type 2 diabetes mellitus without complications: Secondary | ICD-10-CM | POA: Insufficient documentation

## 2019-04-26 DIAGNOSIS — Z7984 Long term (current) use of oral hypoglycemic drugs: Secondary | ICD-10-CM | POA: Insufficient documentation

## 2019-04-26 DIAGNOSIS — E876 Hypokalemia: Secondary | ICD-10-CM | POA: Insufficient documentation

## 2019-04-26 DIAGNOSIS — R258 Other abnormal involuntary movements: Secondary | ICD-10-CM | POA: Insufficient documentation

## 2019-04-26 DIAGNOSIS — Z79899 Other long term (current) drug therapy: Secondary | ICD-10-CM | POA: Insufficient documentation

## 2019-04-26 DIAGNOSIS — I10 Essential (primary) hypertension: Secondary | ICD-10-CM | POA: Insufficient documentation

## 2019-04-26 DIAGNOSIS — G252 Other specified forms of tremor: Secondary | ICD-10-CM

## 2019-04-26 LAB — BASIC METABOLIC PANEL
Anion gap: 10 (ref 5–15)
BUN: 21 mg/dL — ABNORMAL HIGH (ref 6–20)
CO2: 27 mmol/L (ref 22–32)
Calcium: 9.3 mg/dL (ref 8.9–10.3)
Chloride: 103 mmol/L (ref 98–111)
Creatinine, Ser: 0.83 mg/dL (ref 0.44–1.00)
GFR calc Af Amer: >60 mL/min (ref >60)
GFR calc non Af Amer: >60 mL/min (ref >60)
Glucose, Bld: 122 mg/dL — ABNORMAL HIGH (ref 70–99)
Potassium: 3.2 mmol/L — ABNORMAL LOW (ref 3.5–5.1)
Sodium: 140 mmol/L (ref 135–145)

## 2019-04-26 LAB — CBC
HCT: 32.8 % — ABNORMAL LOW (ref 36.0–46.0)
Hemoglobin: 10.6 g/dL — ABNORMAL LOW (ref 12.0–15.0)
MCH: 28.3 pg (ref 26.0–34.0)
MCHC: 32.3 g/dL (ref 30.0–36.0)
MCV: 87.7 fL (ref 80.0–100.0)
Platelets: 304 10*3/uL (ref 150–400)
RBC: 3.74 MIL/uL — ABNORMAL LOW (ref 3.87–5.11)
RDW: 16 % — ABNORMAL HIGH (ref 11.5–15.5)
WBC: 7.8 10*3/uL (ref 4.0–10.5)
nRBC: 0 % (ref 0.0–0.2)

## 2019-04-26 LAB — GLUCOSE, CAPILLARY: Glucose-Capillary: 108 mg/dL — ABNORMAL HIGH (ref 70–99)

## 2019-04-26 LAB — MAGNESIUM: Magnesium: 1.7 mg/dL (ref 1.7–2.4)

## 2019-04-26 MED ORDER — POTASSIUM CHLORIDE CRYS ER 20 MEQ PO TBCR
40.0000 meq | EXTENDED_RELEASE_TABLET | Freq: Once | ORAL | Status: AC
  Administered 2019-04-26: 40 meq via ORAL
  Filled 2019-04-26: qty 2

## 2019-04-26 NOTE — ED Triage Notes (Addendum)
Pt comes via POV from home with c/op tremors. Pt states this started last week. Pt states she thinks it might be one of her depression medication.  Pt unsure if it is the medication or not. Pt states this has happened in the past but hasn't happened in awhile. Pt states headache. Pt states she is diabetic and has CBG has been high. Pt states she checked it at home and it was 207 and pt took 9 of the fast acting. Pt states after that is when she developed the tremors.  Pt denies any other issues.  Current CBG check is 108

## 2019-04-26 NOTE — ED Notes (Signed)
First Nurse Note:  Patient presents to the ED via EMS from home for tremors to her right arm x 1 week.  Patient reports history of tremors, "years ago" but they improved but have been back this past week.  Patient has been taking tylenol and benadryl for her tremors this week.  Per EMS, patient appears anxious.

## 2019-04-26 NOTE — ED Provider Notes (Signed)
[facility] Emergency Department Provider Note   ____________________________________________   I have reviewed the triage vital signs and the nursing notes.   HISTORY  Chief Complaint tremors   History limited by: Not Limited   HPI Jamie Massey is a 49 y.o. female who presents to the emergency department today because of concerns for right arm tremors.  Patient states that they have been hurting for the past week.  They do come and go.  She states she had one episode this morning and another one this afternoon.  She has also had some headache with these tremors.  She says that she has had similar symptoms in the past and has been evaluated including head CT for these tremors.  The patient states that her appetite has been somewhat decreased over the past week.  She has not been eating her normal amount.  Patient denies any recent head trauma.  Denies any fevers.   Records reviewed. Per medical record review patient has a history of CT performed roughly 6 years ago for right arm shaking.  Past Medical History:  Diagnosis Date  . Anxiety   . Diabetes mellitus without complication (HCC)   . GERD (gastroesophageal reflux disease)   . Hypertension     Patient Active Problem List   Diagnosis Date Noted  . Watery diarrhea 02/12/2015  . Lactic acidosis 02/12/2015  . Gastroenteritis 02/12/2015  . Type 2 diabetes mellitus (HCC) 02/12/2015  . HTN (hypertension) 02/12/2015  . GERD (gastroesophageal reflux disease) 02/12/2015  . Anxiety 02/12/2015    Past Surgical History:  Procedure Laterality Date  . BREAST EXCISIONAL BIOPSY Right 1999   neg  . BREAST SURGERY    . EXPLORATORY LAPAROTOMY    . NASAL SINUS SURGERY    . OOPHORECTOMY      Prior to Admission medications   Medication Sig Start Date End Date Taking? Authorizing Provider  ALLERGY 10 MG tablet Take 10 mg by mouth daily. 02/09/15   [provider]  hydrochlorothiazide (HYDRODIURIL) 25  MG tablet Take 25 mg by mouth daily. for high blood pressure 11/30/14   [provider]  hydrOXYzine (VISTARIL) 25 MG capsule Take 1 capsule by mouth at bedtime as needed. 02/09/15   [provider]  metFORMIN (GLUCOPHAGE) 500 MG tablet Take 500 mg by mouth 2 (two) times daily with a meal.    [provider]  ranitidine (ZANTAC) 150 MG tablet Take 1 tablet by mouth 2 (two) times daily. 11/30/14   [provider]    Allergies Patient has no known allergies.  Family History  Problem Relation Age of Onset  . Deep vein thrombosis Other   . Breast cancer Sister 47  . Breast cancer Maternal Aunt 60  . Ovarian cancer Paternal Grandmother     Social History Social History   Tobacco Use  . Smoking status: Former Games developer  . Smokeless tobacco: Never Used  Substance Use Topics  . Alcohol use: No  . Drug use: No    Review of Systems Constitutional: No fever/chills Eyes: No visual changes. ENT: No sore throat. Cardiovascular: Denies chest pain. Respiratory: Denies shortness of breath. Gastrointestinal: No abdominal pain.  No nausea, no vomiting.  No diarrhea.   Genitourinary: Negative for dysuria. Musculoskeletal: Positive for right arm shaking.  Skin: Negative for rash. Neurological: Positive for headache.  ____________________________________________   PHYSICAL EXAM:  VITAL SIGNS: ED Triage Vitals [04/26/19 1642]  Enc Vitals Group     BP 126/71  Pulse Rate 95     Resp 18     Temp 98.4 F (36.9 C)     Temp src      SpO2 99 %     Weight 225 lb (102.1 kg)     Height 5\' 4"  (1.626 m)     Head Circumference      Peak Flow      Pain Score 0   Constitutional: Alert and oriented.  Eyes: Conjunctivae are normal.  ENT      Head: Normocephalic and atraumatic.      Nose: No congestion/rhinnorhea.      Mouth/Throat: Mucous membranes are moist.      Neck: No stridor. Hematological/Lymphatic/Immunilogical: No cervical  lymphadenopathy. Cardiovascular: Normal rate, regular rhythm.  No murmurs, rubs, or gallops.  Respiratory: Normal respiratory effort without tachypnea nor retractions. Breath sounds are clear and equal bilaterally. No wheezes/rales/rhonchi. Gastrointestinal: Soft and non tender. No rebound. No guarding.  Genitourinary: Deferred Musculoskeletal: Normal range of motion in all extremities. No lower extremity edema. Neurologic:  Normal speech and language. No gross focal neurologic deficits are appreciated.  Skin:  Skin is warm, dry and intact. No rash noted. Psychiatric: Mood and affect are normal. Speech and behavior are normal. Patient exhibits appropriate insight and judgment.  ____________________________________________    LABS (pertinent positives/negatives)  CBC wbc 7.8, hgb 10.6, plt 304 BMP na 140, k 3.2, glu 122, cr 0.83 Magnesium 1.7 ____________________________________________   EKG  None  ____________________________________________    RADIOLOGY  None  ____________________________________________   PROCEDURES  Procedures  ____________________________________________   INITIAL IMPRESSION / ASSESSMENT AND PLAN / ED COURSE  Pertinent labs & imaging results that were available during my care of the patient were reviewed by me and considered in my medical decision making (see chart for details).   Patient presents to the emergency department today because of concerns for intermittent right arm tremors that occurred over the past week.  No tremors noted on my exam.  Patient states she had similar symptoms a number of years ago.  Blood work here does show a slight hyper kalemia.  I did discuss this with the patient.  Will give patient potassium.  At this time do not feel any emergent neuroimaging is necessary given similar symptoms and negative neuroimaging in the past.  Do think however it is important for patient follow-up with neurology and I discussed this with  patient. ____________________________________________   FINAL CLINICAL IMPRESSION(S) / ED DIAGNOSES  Final diagnoses:  Coarse tremors  Hypokalemia     Note: This dictation was prepared with Dragon dictation. Any transcriptional errors that result from this process are unintentional     Nance Pear, MD 04/26/19 1944

## 2019-04-26 NOTE — Discharge Instructions (Signed)
Please follow up with the neurologist if symptoms persist. Please seek medical attention for any high fevers, chest pain, shortness of breath, change in behavior, persistent vomiting, bloody stool or any other new or concerning symptoms.

## 2019-08-03 DIAGNOSIS — R2240 Localized swelling, mass and lump, unspecified lower limb: Secondary | ICD-10-CM | POA: Insufficient documentation

## 2020-06-22 DIAGNOSIS — I10 Essential (primary) hypertension: Secondary | ICD-10-CM | POA: Diagnosis not present

## 2020-06-22 DIAGNOSIS — Z0000 Encounter for general adult medical examination without abnormal findings: Secondary | ICD-10-CM | POA: Diagnosis not present

## 2020-06-22 DIAGNOSIS — R87610 Atypical squamous cells of undetermined significance on cytologic smear of cervix (ASC-US): Secondary | ICD-10-CM | POA: Diagnosis not present

## 2020-06-22 DIAGNOSIS — E119 Type 2 diabetes mellitus without complications: Secondary | ICD-10-CM | POA: Diagnosis not present

## 2020-06-22 DIAGNOSIS — D509 Iron deficiency anemia, unspecified: Secondary | ICD-10-CM | POA: Diagnosis not present

## 2020-07-13 DIAGNOSIS — L438 Other lichen planus: Secondary | ICD-10-CM | POA: Insufficient documentation

## 2021-12-04 ENCOUNTER — Emergency Department: Payer: Medicaid Other

## 2021-12-04 ENCOUNTER — Encounter: Payer: Self-pay | Admitting: *Deleted

## 2021-12-04 ENCOUNTER — Emergency Department
Admission: EM | Admit: 2021-12-04 | Discharge: 2021-12-04 | Disposition: A | Discharge status: Home / Self Care | Payer: Medicaid Other | Attending: Student in an Organized Health Care Education/Training Program | Admitting: Student in an Organized Health Care Education/Training Program

## 2021-12-04 ENCOUNTER — Other Ambulatory Visit: Payer: Self-pay

## 2021-12-04 DIAGNOSIS — R7309 Other abnormal glucose: Secondary | ICD-10-CM | POA: Insufficient documentation

## 2021-12-04 DIAGNOSIS — G8929 Other chronic pain: Secondary | ICD-10-CM | POA: Insufficient documentation

## 2021-12-04 DIAGNOSIS — M25512 Pain in left shoulder: Secondary | ICD-10-CM | POA: Insufficient documentation

## 2021-12-04 LAB — CBG MONITORING, ED: Glucose-Capillary: 129 mg/dL — ABNORMAL HIGH (ref 70–99)

## 2021-12-04 LAB — CBC WITH DIFFERENTIAL/PLATELET

## 2021-12-04 LAB — COMPREHENSIVE METABOLIC PANEL
ALT: UNDETERMINED U/L (ref 0–44)
AST: UNDETERMINED U/L (ref 15–41)
Albumin: UNDETERMINED g/dL (ref 3.5–5.0)
Alkaline Phosphatase: UNDETERMINED U/L (ref 38–126)
Anion gap: UNDETERMINED (ref 5–15)
BUN: UNDETERMINED mg/dL (ref 6–20)
CO2: UNDETERMINED mmol/L (ref 22–32)
Calcium: UNDETERMINED mg/dL (ref 8.9–10.3)
Chloride: UNDETERMINED mmol/L (ref 98–111)
Creatinine, Ser: UNDETERMINED mg/dL (ref 0.44–1.00)
GFR, Estimated: UNDETERMINED mL/min (ref >60)
Glucose, Bld: 117 mg/dL — ABNORMAL HIGH (ref 70–99)
Potassium: UNDETERMINED mmol/L (ref 3.5–5.1)
Sodium: UNDETERMINED mmol/L (ref 135–145)
Total Bilirubin: UNDETERMINED mg/dL (ref 0.3–1.2)
Total Protein: UNDETERMINED g/dL (ref 6.5–8.1)

## 2021-12-04 LAB — TROPONIN I (HIGH SENSITIVITY): Troponin I (High Sensitivity): 3 ng/L (ref <18)

## 2021-12-04 MED ORDER — OXYCODONE-ACETAMINOPHEN 5-325 MG PO TABS: 1.0000 | ORAL_TABLET | ORAL | 0 refills | Status: DC | PRN

## 2021-12-04 MED ORDER — LIDOCAINE 5 % EX PTCH: 1.0000 | MEDICATED_PATCH | Freq: Two times a day (BID) | CUTANEOUS | 0 refills | Status: AC

## 2021-12-04 NOTE — ED Provider Triage Note (Signed)
Emergency Medicine Provider Triage Evaluation Note  Jamie Massey , a 51 y.o. female  was evaluated in triage.  Pt complains of nontraumatic left upper arm pain.  Patient has pain in the medial left upper arm.  No cardiac history.  No chest pain or shortness of breath.  Patient does have "bleeding and clotting problems" but unsure the exact nature of same.  No neck pain..  Review of Systems  Positive: Positive for left upper arm pain. Negative: Chest pain, shortness of breath, URI symptoms, cough  Physical Exam  Ht 5\' 5"  (1.651 m)   Wt 102 kg   BMI 37.42 kg/m  Gen:   Awake, no distress   Resp:  Normal effort  MSK:   Moves extremities without difficulty.  Slight tenderness along the medial upper arm over the brachial region.  No gross ecchymosis, edema or erythema Other:    Medical Decision Making  Medically screening exam initiated at 8:03 PM.  Appropriate orders placed.  Massey was informed that the remainder of the evaluation will be completed by another provider, this initial triage assessment does not replace that evaluation, and the importance of remaining in the ED until their evaluation is complete.  Patient with left upper arm pain.  History of bleeding clotting disorders, no cardiac history.  Patient will have EKG, labs, x-rays and ultrasound.   Jamie Massey 12/04/21 2003

## 2021-12-04 NOTE — ED Triage Notes (Signed)
Pt has left shoulder pain.  Limited rom.  Intermittent pain, pain worse during past month.no neck or back pain.  Pain radiates into left elbow.  Pt taking motrin and tylenol without relief.  No chest pain or sob.   Pt alert  speech clear.

## 2021-12-04 NOTE — ED Notes (Signed)
Fsbs 129

## 2021-12-04 NOTE — ED Provider Notes (Signed)
[facility] Provider Note    Event Date/Time   First MD Initiated Contact with Patient 12/04/21 2122     (approximate)   History   Shoulder Pain   HPI  TAYLEIGH WETHERELL is a 51 y.o. female   presents the ER for evaluation of 3 months of left shoulder pain worsened with movement.  States that it has been daily.  She denies any back pain denies any chest pain or shortness of breath.  No numbness or tingling.  Does not recall any particular injury but feels like she has been using a bit more when she gets up to move or while at work.  She has been taking Motrin without improvement.  Has not had any diaphoresis no redness or swelling.      Physical Exam   Triage Vital Signs: ED Triage Vitals  Enc Vitals Group     BP 12/04/21 2001 (!) 160/99     Pulse Rate 12/04/21 2001 87     Resp 12/04/21 2001 18     Temp 12/04/21 2001 98.1 F (36.7 C)     Temp Source 12/04/21 2001 Oral     SpO2 12/04/21 2001 98 %     Weight 12/04/21 1956 224 lb 13.9 oz (102 kg)     Height 12/04/21 1956 5\' 5"  (1.651 m)     Head Circumference --      Peak Flow --      Pain Score 12/04/21 1956 7     Pain Loc --      Pain Edu? --      Excl. in GC? --     Most recent vital signs: Vitals:   12/04/21 2001  BP: (!) 160/99  Pulse: 87  Resp: 18  Temp: 98.1 F (36.7 C)  SpO2: 98%     Constitutional: Alert  Eyes: Conjunctivae are normal.  Head: Atraumatic. Nose: No congestion/rhinnorhea. Mouth/Throat: Mucous membranes are moist.   Neck: Painless ROM.  Cardiovascular:   Good peripheral circulation. Respiratory: Normal respiratory effort.  No retractions.  Gastrointestinal: Soft and nontender.  Musculoskeletal:  no deformity, there is tenderness to palpation in the anterior shoulder along the biceps tendon.  She is able to raise arm with some distal occultly above the shoulder.  Neurovascular intact distally.  No contusion no overlying warmth or erythema. Neurologic:  MAE  spontaneously. No gross focal neurologic deficits are appreciated.  Skin:  Skin is warm, dry and intact. No rash noted. Psychiatric: Mood and affect are normal. Speech and behavior are normal.    ED Results / Procedures / Treatments   Labs (all labs ordered are listed, but only abnormal results are displayed) Labs Reviewed  COMPREHENSIVE METABOLIC PANEL - Abnormal; Notable for the following components:      Result Value   Glucose, Bld 117 (*)    All other components within normal limits  CBG MONITORING, ED - Abnormal; Notable for the following components:   Glucose-Capillary 129 (*)    All other components within normal limits  CBC WITH DIFFERENTIAL/PLATELET  CBC WITH DIFFERENTIAL/PLATELET  COMPREHENSIVE METABOLIC PANEL  TROPONIN I (HIGH SENSITIVITY)  TROPONIN I (HIGH SENSITIVITY)     EKG  ED ECG REPORT I, 12/06/21, the attending physician, personally viewed and interpreted this ECG.   Date: 12/04/2021  EKG Time: 20:09  Rate: 80  Rhythm: sinus  Axis: normal  Intervals: normal  ST&T Change: no stemi, no depressions    RADIOLOGY Please see ED Course for  my review and interpretation.  I personally reviewed all radiographic images ordered to evaluate for the above acute complaints and reviewed radiology reports and findings.  These findings were personally discussed with the patient.  Please see medical record for radiology report.    PROCEDURES:  Critical Care performed: No  Procedures   MEDICATIONS ORDERED IN ED: Medications - No data to display   IMPRESSION / MDM / ASSESSMENT AND PLAN / ED COURSE  I reviewed the triage vital signs and the nursing notes.                              Differential diagnosis includes, but is not limited to, fracture, contusion, arthritis, tendinitis, ligamentous injury, doubt ACS PE or dissection.  Patient presenting with musculoskeletal pain on the left shoulder for the past 3 months.  No fevers not consistent  with septic joint or cellulitis.  EKG is nonischemic.  Troponin negative.  Remainder of blood work was unable to result due to limited quantity I do not feel that further laboratory testing clinically indicated.  This is most consistent with viral tendinitis we will give topical analgesics as well as short course of pain medication until patient can follow-up with PCP.  Patient does appear stable appropriate for outpatient follow-up.        FINAL CLINICAL IMPRESSION(S) / ED DIAGNOSES   Final diagnoses:  Chronic left shoulder pain     Rx / DC Orders   ED Discharge Orders          Ordered    oxyCODONE-acetaminophen (PERCOCET) 5-325 MG tablet  Every 4 hours PRN        12/04/21 2210    lidocaine (LIDODERM) 5 %  Every 12 hours        12/04/21 2210             Note:  This document was prepared using Dragon voice recognition software and may include unintentional dictation errors.    Willy Eddy, MD 12/04/21 [phone]

## 2021-12-14 DIAGNOSIS — Z23 Encounter for immunization: Secondary | ICD-10-CM | POA: Diagnosis not present

## 2021-12-14 DIAGNOSIS — Z1389 Encounter for screening for other disorder: Secondary | ICD-10-CM | POA: Diagnosis not present

## 2021-12-14 DIAGNOSIS — M25512 Pain in left shoulder: Secondary | ICD-10-CM | POA: Diagnosis not present

## 2021-12-14 DIAGNOSIS — Z712 Person consulting for explanation of examination or test findings: Secondary | ICD-10-CM | POA: Diagnosis not present

## 2021-12-14 DIAGNOSIS — Z0131 Encounter for examination of blood pressure with abnormal findings: Secondary | ICD-10-CM | POA: Diagnosis not present

## 2022-01-08 DIAGNOSIS — M7592 Shoulder lesion, unspecified, left shoulder: Secondary | ICD-10-CM | POA: Diagnosis not present

## 2022-01-08 DIAGNOSIS — M25512 Pain in left shoulder: Secondary | ICD-10-CM | POA: Diagnosis not present

## 2022-01-14 DIAGNOSIS — M25512 Pain in left shoulder: Secondary | ICD-10-CM | POA: Diagnosis not present

## 2022-01-22 DIAGNOSIS — M25612 Stiffness of left shoulder, not elsewhere classified: Secondary | ICD-10-CM | POA: Diagnosis not present

## 2022-01-22 DIAGNOSIS — M25512 Pain in left shoulder: Secondary | ICD-10-CM | POA: Diagnosis not present

## 2022-01-25 DIAGNOSIS — M25512 Pain in left shoulder: Secondary | ICD-10-CM | POA: Diagnosis not present

## 2022-01-25 DIAGNOSIS — M25612 Stiffness of left shoulder, not elsewhere classified: Secondary | ICD-10-CM | POA: Diagnosis not present

## 2022-04-06 ENCOUNTER — Ambulatory Visit
Admission: EM | Admit: 2022-04-06 | Discharge: 2022-04-06 | Disposition: A | Discharge status: Home / Self Care | Payer: Medicaid Other | Attending: Physician Assistant | Admitting: Physician Assistant

## 2022-04-06 ENCOUNTER — Ambulatory Visit (INDEPENDENT_AMBULATORY_CARE_PROVIDER_SITE_OTHER): Payer: Medicaid Other

## 2022-04-06 DIAGNOSIS — Z794 Long term (current) use of insulin: Secondary | ICD-10-CM | POA: Diagnosis not present

## 2022-04-06 DIAGNOSIS — R059 Cough, unspecified: Secondary | ICD-10-CM | POA: Diagnosis not present

## 2022-04-06 DIAGNOSIS — R0789 Other chest pain: Secondary | ICD-10-CM

## 2022-04-06 DIAGNOSIS — R051 Acute cough: Secondary | ICD-10-CM | POA: Diagnosis not present

## 2022-04-06 DIAGNOSIS — J189 Pneumonia, unspecified organism: Secondary | ICD-10-CM | POA: Diagnosis not present

## 2022-04-06 DIAGNOSIS — R0602 Shortness of breath: Secondary | ICD-10-CM | POA: Diagnosis not present

## 2022-04-06 DIAGNOSIS — Z1152 Encounter for screening for COVID-19: Secondary | ICD-10-CM | POA: Insufficient documentation

## 2022-04-06 DIAGNOSIS — R079 Chest pain, unspecified: Secondary | ICD-10-CM | POA: Diagnosis not present

## 2022-04-06 DIAGNOSIS — E1165 Type 2 diabetes mellitus with hyperglycemia: Secondary | ICD-10-CM | POA: Diagnosis not present

## 2022-04-06 LAB — GLUCOSE, CAPILLARY: Glucose-Capillary: 180 mg/dL — ABNORMAL HIGH (ref 70–99)

## 2022-04-06 LAB — GROUP A STREP BY PCR: Group A Strep by PCR: NOT DETECTED

## 2022-04-06 LAB — SARS CORONAVIRUS 2 BY RT PCR: SARS Coronavirus 2 by RT PCR: NEGATIVE

## 2022-04-06 MED ORDER — PROMETHAZINE-DM 6.25-15 MG/5ML PO SYRP: 5.0000 mL | ORAL_SOLUTION | Freq: Four times a day (QID) | ORAL | 0 refills | Status: DC | PRN

## 2022-04-06 MED ORDER — AZITHROMYCIN 250 MG PO TABS: 250.0000 mg | ORAL_TABLET | Freq: Every day | ORAL | 0 refills | Status: DC

## 2022-04-06 MED ORDER — AMOXICILLIN-POT CLAVULANATE 875-125 MG PO TABS: 1.0000 | ORAL_TABLET | Freq: Two times a day (BID) | ORAL | 0 refills | Status: DC

## 2022-04-06 MED ORDER — AMOXICILLIN-POT CLAVULANATE 875-125 MG PO TABS: 1.0000 | ORAL_TABLET | Freq: Two times a day (BID) | ORAL | 0 refills | Status: AC

## 2022-04-06 NOTE — Discharge Instructions (Addendum)
-  Negative strep and COVID.  -You have pneumonia so I sent 2 different antibiotics and cough medicine. Use inhaler as needed for shortness of breath -You should be feeling better over the next week -Go to ER if you feel worse, have uncontrollable fever, worsening chest pain, increased fatigue

## 2022-04-06 NOTE — ED Triage Notes (Signed)
Pt is worried about allergies and states that she was cleaning out her vacuum cleaner and noticed a lot of dust. Pt cough began that night.

## 2022-04-06 NOTE — ED Triage Notes (Signed)
Pt c/o "hacking" cough, sore ribs, sore throat from coughing, chest congestion, trouble sleeping due to cough x3days  Pt has been taking Mucinex/Guaifenesin and using her inhaler. Pt has picked up chlorpheniramine. Pt states that the medicine is not working well and she is not getting enough mucus out of her chest.

## 2022-04-06 NOTE — ED Provider Notes (Addendum)
[facility]    CSN: [id number] Arrival date & time: 04/06/22  1146      History   Chief Complaint Chief Complaint  Patient presents with   Cough    HPI Jamie Massey is a 52 y.o. female with a history of diabetes, anxiety, hypertension and obesity.  Patient presents today for feeling poorly for the past 3 days.  Reports fatigue, aching in her back, cough that is occasionally productive of yellowish sputum, sore throat when coughing, chest congestion, difficulty sleeping related to cough, chest pressure and shortness of breath.  Reports feeling feverish but has not had a fever.  No vomiting or diarrhea.  reports reduced appetite.  States she thinks her sugar might be low.  Has taken her insulin today but has not eaten.  Denies any sick contacts or known exposure to flu or COVID.  No history of asthma or lung disease.  Has tried OTC meds including Mucinex and other cough medication without relief.  No other complaints or concerns.  HPI  Past Medical History:  Diagnosis Date   Anxiety    Diabetes mellitus without complication ([place])    GERD (gastroesophageal reflux disease)    Hypertension     Patient Active Problem List   Diagnosis Date Noted   Watery diarrhea 02/12/2015   Lactic acidosis 02/12/2015   Gastroenteritis 02/12/2015   Type 2 diabetes mellitus ([place]) 02/12/2015   HTN (hypertension) 02/12/2015   GERD (gastroesophageal reflux disease) 02/12/2015   Anxiety 02/12/2015    Past Surgical History:  Procedure Laterality Date   BREAST EXCISIONAL BIOPSY Right 1999   neg   BREAST SURGERY     EXPLORATORY LAPAROTOMY     NASAL SINUS SURGERY     OOPHORECTOMY      OB History   No obstetric history on file.      Home Medications    Prior to Admission medications   Medication Sig Start Date End Date Taking? Authorizing Provider  ALLERGY 10 MG tablet Take 10 mg by mouth daily. 02/09/15  Yes [provider]  atorvastatin (LIPITOR) 10 MG tablet TAKE 1  Tablet BY MOUTH ONCE EVERY EVENING FOR CHOLESTEROL   Yes [provider]  DULoxetine (CYMBALTA) 30 MG capsule TAKE 1 Capsule BY MOUTH ONCE DAILY FOR PAIN AND MOOD DOSE CHANGE   Yes [provider]  HUMALOG KWIKPEN 100 UNIT/ML KwikPen INJECT 15 UNITS UNDER THE SKIN 3 TIMES DAILY WITH MEALS   Yes [provider]  hydrochlorothiazide (HYDRODIURIL) 25 MG tablet Take 25 mg by mouth daily. for high blood pressure 11/30/14  Yes [provider]  hydrOXYzine (VISTARIL) 25 MG capsule Take 1 capsule by mouth at bedtime as needed. 02/09/15  Yes [provider]  Insulin Glargine (BASAGLAR KWIKPEN) 100 UNIT/ML INJECT 80 UNITS UNDER THE SKIN EVERY NIGHT FOR DIABETES   Yes [provider]  lidocaine (LIDODERM) 5 % Place 1 patch onto the skin every 12 (twelve) hours. Remove & Discard patch within 12 hours or as directed by MD 12/04/21 12/04/22 Yes Merlyn Lot, MD  metFORMIN (GLUCOPHAGE) 500 MG tablet Take 500 mg by mouth 2 (two) times daily with a meal.   Yes [provider]  amoxicillin-clavulanate (AUGMENTIN) 875-125 MG tablet Take 1 tablet by mouth every 12 (twelve) hours for 7 days. 04/06/22 04/13/22  Laurene Footman B, PA-C  azithromycin (ZITHROMAX) 250 MG tablet Take 1 tablet (250 mg total) by mouth daily. Take first 2 tablets together, then 1 every day until  finished. 04/06/22   Danton Clap, PA-C  promethazine-dextromethorphan (PROMETHAZINE-DM) 6.25-15 MG/5ML syrup Take 5 mLs by mouth 4 (four) times daily as needed. 04/06/22   Danton Clap, PA-C  ranitidine (ZANTAC) 150 MG tablet Take 1 tablet by mouth 2 (two) times daily. 11/30/14   [provider]    Family History Family History  Problem Relation Age of Onset   Deep vein thrombosis Other    Breast cancer Sister 87   Breast cancer Maternal Aunt 49   Ovarian cancer Paternal Grandmother     Social History Social History   Tobacco Use   Smoking status: Former   Smokeless  tobacco: Never  Scientific laboratory technician Use: Never used  Substance Use Topics   Alcohol use: No   Drug use: No     Allergies   Patient has no known allergies.   Review of Systems Review of Systems  Constitutional:  Positive for fatigue. Negative for chills, diaphoresis and fever.  HENT:  Positive for congestion, rhinorrhea and sore throat. Negative for ear pain, sinus pressure and sinus pain.   Respiratory:  Positive for cough, chest tightness and shortness of breath.   Cardiovascular:  Negative for chest pain and palpitations.  Gastrointestinal:  Negative for abdominal pain, diarrhea, nausea and vomiting.  Musculoskeletal:  Positive for back pain and myalgias. Negative for arthralgias.  Skin:  Negative for rash.  Neurological:  Negative for dizziness, weakness and headaches.  Hematological:  Negative for adenopathy.     Physical Exam Triage Vital Signs ED Triage Vitals  Enc Vitals Group     BP      Pulse      Resp      Temp      Temp src      SpO2      Weight      Height      Head Circumference      Peak Flow      Pain Score      Pain Loc      Pain Edu?      Excl. in [place]?    No data found.  Updated Vital Signs BP 119/79 (BP Location: Left Arm)   Pulse 99   Temp 98.7 F (37.1 C) (Oral)   Ht 5\' 3"  (1.6 m)   Wt 240 lb (108.9 kg)   SpO2 95%   BMI 42.51 kg/m     Physical Exam Vitals and nursing note reviewed.  Constitutional:      General: She is not in acute distress.    Appearance: Normal appearance. She is obese. She is ill-appearing. She is not toxic-appearing.  HENT:     Head: Normocephalic and atraumatic.     Nose: Congestion present.     Mouth/Throat:     Mouth: Mucous membranes are moist.     Pharynx: Oropharynx is clear. Posterior oropharyngeal erythema present.  Eyes:     General: No scleral icterus.       Right eye: No discharge.        Left eye: No discharge.     Conjunctiva/sclera: Conjunctivae normal.  Cardiovascular:     Rate and  Rhythm: Normal rate and regular rhythm.     Heart sounds: Normal heart sounds.  Pulmonary:     Effort: Pulmonary effort is normal. No respiratory distress.     Breath sounds: Wheezing (few scattered wheezes throughout) present.  Musculoskeletal:     Cervical back: Neck supple.  Skin:  General: Skin is dry.  Neurological:     General: No focal deficit present.     Mental Status: She is alert. Mental status is at baseline.     Motor: No weakness.     Gait: Gait normal.  Psychiatric:        Mood and Affect: Mood normal.        Behavior: Behavior normal.        Thought Content: Thought content normal.      UC Treatments / Results  Labs (all labs ordered are listed, but only abnormal results are displayed) Labs Reviewed  GLUCOSE, CAPILLARY - Abnormal; Notable for the following components:      Result Value   Glucose-Capillary 180 (*)    All other components within normal limits  SARS CORONAVIRUS 2 BY RT PCR  GROUP A STREP BY PCR  CBG MONITORING, ED    EKG   Radiology DG Chest 2 View  Result Date: 04/06/2022 CLINICAL DATA:  Cough, shortness of breath EXAM: CHEST - 2 VIEW COMPARISON:  08/15/2012 FINDINGS: Patchy airspace disease medially in the right middle lobe, new since previous. Left lung clear. Heart size and mediastinal contours are within normal limits. No effusion. Visualized bones unremarkable. IMPRESSION: Patchy right middle lobe airspace disease. Electronically Signed   By: Lucrezia Europe M.D.   On: 04/06/2022 12:34    Procedures ED EKG  Date/Time: 04/06/2022 2:01 PM  Performed by: Danton Clap, PA-C Authorized by: Danton Clap, PA-C   Interpretation:    Interpretation: normal   Rate:    ECG rate:  98   ECG rate assessment: normal   Rhythm:    Rhythm: sinus rhythm   Ectopy:    Ectopy: none   QRS:    QRS axis:  Normal   QRS intervals:  Normal   QRS conduction: normal   ST segments:    ST segments:  Normal T waves:    T waves: normal   Comments:      Normal sinus rhythm. Regular rate.  (including critical care time)  Medications Ordered in UC Medications - No data to display  Initial Impression / Assessment and Plan / UC Course  I have reviewed the triage vital signs and the nursing notes.  Pertinent labs & imaging results that were available during my care of the patient were reviewed by me and considered in my medical decision making (see chart for details).   52 year old female with history of diabetes, hypertension presents for 3-day history of fatigue, feeling feverish, productive cough, sore throat, sinus pressure, shortness of breath.  No sick contacts.  Vitals are all normal and stable.  She is ill-appearing but nontoxic.  On exam has nasal congestion, mild posterior pharyngeal erythema.  Few scattered wheezes throughout chest.  Fingerstick glucose obtained since patient reports increased fatigue and has taken insulin without eating.  Chest x-ray ordered to assess for possible pneumonia.  COVID testing and strep testing also obtained.   Negative COVID and strep.  Blood sugar 180.  Discussed result patient. Patient to take insulin at home and eat something small. Advised monitoring closely.  Chest x-ray shows patchy airspace disease in the right middle lobe.  Suspect this is consistent with pneumonia.  Will treat patient with Augmentin and azithromycin.  Also prescribed Promethazine DM.  She has an inhaler at home which she is going to use for any shortness of breath.  Reviewed plenty of rest and fluids, supportive care.  Reviewed return to ED  precautions.  Repeat x-ray in 4 to 6 weeks.  Patient returned to urgent care within 30 minutes of discharge stating she was having increased pressure of the left shoulder blade and somewhat of her chest.  Requesting EKG.  EKG performed shows normal sinus rhythm and regular rate.  No ST or T wave changes compared to EKG from last year.  Advised patient that her discomfort could come from  the fact that she is sick currently and does have pneumonia.  However, we discussed that if her pain worsens she should go to the emergency department.  She has taken Tylenol and says that has helped the pain a little bit.  She reports that she is very anxious now and that could play a role.  Advised her to go home, take medications as prescribed but if at any point your symptoms worsen or she has increased shortness of breath, pain radiating to left arm, neck or jaw, numbness/tingling or weakness, increased dizziness she should call 911 or go to ER.  Patient is agreeable.  Her daughter is with her.   Final Clinical Impressions(s) / UC Diagnoses   Final diagnoses:  Pneumonia of right middle lobe due to infectious organism  Acute cough  Shortness of breath  Type 2 diabetes mellitus with hyperglycemia, with long-term current use of insulin ([facility])  Chest pressure     Discharge Instructions      -Negative strep and COVID.  -You have pneumonia so I sent 2 different antibiotics and cough medicine. Use inhaler as needed for shortness of breath -You should be feeling better over the next week -Go to ER if you feel worse, have uncontrollable fever, worsening chest pain, increased fatigue     ED Prescriptions     Medication Sig Dispense Auth. Provider   amoxicillin-clavulanate (AUGMENTIN) 875-125 MG tablet  (Status: Discontinued) Take 1 tablet by mouth every 12 (twelve) hours for 7 days. 14 tablet Laurene Footman B, PA-C   azithromycin (ZITHROMAX) 250 MG tablet  (Status: Discontinued) Take 1 tablet (250 mg total) by mouth daily. Take first 2 tablets together, then 1 every day until finished. 6 tablet Danton Clap, PA-C   promethazine-dextromethorphan (PROMETHAZINE-DM) 6.25-15 MG/5ML syrup  (Status: Discontinued) Take 5 mLs by mouth 4 (four) times daily as needed. 118 mL Laurene Footman B, PA-C   amoxicillin-clavulanate (AUGMENTIN) 875-125 MG tablet Take 1 tablet by mouth every 12 (twelve) hours  for 7 days. 14 tablet Laurene Footman B, PA-C   azithromycin (ZITHROMAX) 250 MG tablet Take 1 tablet (250 mg total) by mouth daily. Take first 2 tablets together, then 1 every day until finished. 6 tablet Danton Clap, PA-C   promethazine-dextromethorphan (PROMETHAZINE-DM) 6.25-15 MG/5ML syrup Take 5 mLs by mouth 4 (four) times daily as needed. 118 mL Danton Clap, PA-C      PDMP not reviewed this encounter.   Danton Clap, PA-C 04/06/22 1327    Laurene Footman B, PA-C 04/06/22 [id number]

## 2022-04-08 ENCOUNTER — Telehealth: Payer: Self-pay

## 2022-04-08 NOTE — Telephone Encounter (Signed)
Pt was seen 04/06/22 for pneumonia of right middle lobe.   Pt was given Augmentin, azithromycin, and promethazine syrup for cough. Pt calling asking to be sent something different to pharmacy for cough stating cough syrup is not helping.   Spoke to Dr. Susa Simmonds and stated we could send in [place] & informed pt but pt stated that does not work for her. Pt then stated she would call her PCP to try to get an appointment and if unable to she would return here.

## 2022-04-15 ENCOUNTER — Ambulatory Visit
Admission: RE | Admit: 2022-04-15 | Discharge: 2022-04-15 | Disposition: A | Discharge status: Home / Self Care | Payer: Medicaid Other | Source: Ambulatory Visit | Attending: Family Medicine | Admitting: Family Medicine

## 2022-04-15 ENCOUNTER — Other Ambulatory Visit: Payer: Self-pay | Admitting: Family Medicine

## 2022-04-15 DIAGNOSIS — J189 Pneumonia, unspecified organism: Secondary | ICD-10-CM | POA: Diagnosis not present

## 2022-04-15 DIAGNOSIS — Z712 Person consulting for explanation of examination or test findings: Secondary | ICD-10-CM | POA: Diagnosis not present

## 2022-04-15 DIAGNOSIS — Z0130 Encounter for examination of blood pressure without abnormal findings: Secondary | ICD-10-CM | POA: Diagnosis not present

## 2022-04-15 DIAGNOSIS — Z1389 Encounter for screening for other disorder: Secondary | ICD-10-CM | POA: Diagnosis not present

## 2022-04-15 DIAGNOSIS — J4531 Mild persistent asthma with (acute) exacerbation: Secondary | ICD-10-CM | POA: Insufficient documentation

## 2022-04-16 DIAGNOSIS — J4531 Mild persistent asthma with (acute) exacerbation: Secondary | ICD-10-CM | POA: Diagnosis not present

## 2022-04-29 DIAGNOSIS — M25512 Pain in left shoulder: Secondary | ICD-10-CM | POA: Diagnosis not present

## 2022-04-29 DIAGNOSIS — M7592 Shoulder lesion, unspecified, left shoulder: Secondary | ICD-10-CM | POA: Diagnosis not present

## 2022-06-05 DIAGNOSIS — Z0130 Encounter for examination of blood pressure without abnormal findings: Secondary | ICD-10-CM | POA: Diagnosis not present

## 2022-06-05 DIAGNOSIS — M75102 Unspecified rotator cuff tear or rupture of left shoulder, not specified as traumatic: Secondary | ICD-10-CM | POA: Diagnosis not present

## 2022-06-05 DIAGNOSIS — Z1389 Encounter for screening for other disorder: Secondary | ICD-10-CM | POA: Diagnosis not present

## 2022-06-05 DIAGNOSIS — E119 Type 2 diabetes mellitus without complications: Secondary | ICD-10-CM | POA: Diagnosis not present

## 2022-06-05 DIAGNOSIS — Z712 Person consulting for explanation of examination or test findings: Secondary | ICD-10-CM | POA: Diagnosis not present

## 2022-06-05 DIAGNOSIS — G4700 Insomnia, unspecified: Secondary | ICD-10-CM | POA: Diagnosis not present

## 2022-06-24 LAB — HM DIABETES EYE EXAM

## 2022-06-25 DIAGNOSIS — H5213 Myopia, bilateral: Secondary | ICD-10-CM | POA: Diagnosis not present

## 2022-08-01 DIAGNOSIS — Z0131 Encounter for examination of blood pressure with abnormal findings: Secondary | ICD-10-CM | POA: Diagnosis not present

## 2022-08-01 DIAGNOSIS — D509 Iron deficiency anemia, unspecified: Secondary | ICD-10-CM | POA: Diagnosis not present

## 2022-08-01 DIAGNOSIS — Z1389 Encounter for screening for other disorder: Secondary | ICD-10-CM | POA: Diagnosis not present

## 2022-08-01 DIAGNOSIS — Z1331 Encounter for screening for depression: Secondary | ICD-10-CM | POA: Diagnosis not present

## 2022-08-01 DIAGNOSIS — Z712 Person consulting for explanation of examination or test findings: Secondary | ICD-10-CM | POA: Diagnosis not present

## 2022-08-01 DIAGNOSIS — U099 Post covid-19 condition, unspecified: Secondary | ICD-10-CM | POA: Diagnosis not present

## 2022-08-01 DIAGNOSIS — R5383 Other fatigue: Secondary | ICD-10-CM | POA: Diagnosis not present

## 2022-08-01 DIAGNOSIS — D519 Vitamin B12 deficiency anemia, unspecified: Secondary | ICD-10-CM | POA: Diagnosis not present

## 2022-10-22 DIAGNOSIS — Z23 Encounter for immunization: Secondary | ICD-10-CM | POA: Diagnosis not present

## 2022-11-25 ENCOUNTER — Telehealth: Payer: Self-pay | Admitting: Physician Assistant

## 2022-11-29 ENCOUNTER — Ambulatory Visit: Payer: Medicaid Other | Admitting: Physician Assistant

## 2023-02-07 DIAGNOSIS — Z1389 Encounter for screening for other disorder: Secondary | ICD-10-CM | POA: Diagnosis not present

## 2023-02-07 DIAGNOSIS — Z0130 Encounter for examination of blood pressure without abnormal findings: Secondary | ICD-10-CM | POA: Diagnosis not present

## 2023-02-07 DIAGNOSIS — E119 Type 2 diabetes mellitus without complications: Secondary | ICD-10-CM | POA: Diagnosis not present

## 2023-02-07 DIAGNOSIS — J069 Acute upper respiratory infection, unspecified: Secondary | ICD-10-CM | POA: Diagnosis not present

## 2023-02-07 DIAGNOSIS — Z712 Person consulting for explanation of examination or test findings: Secondary | ICD-10-CM | POA: Diagnosis not present

## 2023-02-17 ENCOUNTER — Other Ambulatory Visit: Payer: Self-pay

## 2023-02-17 ENCOUNTER — Telehealth: Payer: Self-pay | Admitting: Family Medicine

## 2023-02-17 ENCOUNTER — Encounter: Payer: Self-pay | Admitting: Family Medicine

## 2023-02-17 ENCOUNTER — Ambulatory Visit: Payer: Medicaid Other | Admitting: Family Medicine

## 2023-02-17 VITALS — BP 127/81 | HR 85 | Wt 218.0 lb

## 2023-02-17 DIAGNOSIS — E1159 Type 2 diabetes mellitus with other circulatory complications: Secondary | ICD-10-CM

## 2023-02-17 DIAGNOSIS — J4531 Mild persistent asthma with (acute) exacerbation: Secondary | ICD-10-CM | POA: Diagnosis not present

## 2023-02-17 DIAGNOSIS — J309 Allergic rhinitis, unspecified: Secondary | ICD-10-CM

## 2023-02-17 DIAGNOSIS — H65493 Other chronic nonsuppurative otitis media, bilateral: Secondary | ICD-10-CM | POA: Diagnosis not present

## 2023-02-17 DIAGNOSIS — Z1211 Encounter for screening for malignant neoplasm of colon: Secondary | ICD-10-CM

## 2023-02-17 DIAGNOSIS — Z794 Long term (current) use of insulin: Secondary | ICD-10-CM

## 2023-02-17 DIAGNOSIS — E1165 Type 2 diabetes mellitus with hyperglycemia: Secondary | ICD-10-CM | POA: Diagnosis not present

## 2023-02-17 DIAGNOSIS — I152 Hypertension secondary to endocrine disorders: Secondary | ICD-10-CM | POA: Diagnosis not present

## 2023-02-17 DIAGNOSIS — E785 Hyperlipidemia, unspecified: Secondary | ICD-10-CM | POA: Diagnosis not present

## 2023-02-17 DIAGNOSIS — E1169 Type 2 diabetes mellitus with other specified complication: Secondary | ICD-10-CM | POA: Diagnosis not present

## 2023-02-17 DIAGNOSIS — Z1231 Encounter for screening mammogram for malignant neoplasm of breast: Secondary | ICD-10-CM

## 2023-02-17 DIAGNOSIS — H938X3 Other specified disorders of ear, bilateral: Secondary | ICD-10-CM | POA: Diagnosis not present

## 2023-02-17 MED ORDER — OZEMPIC (0.25 OR 0.5 MG/DOSE) 2 MG/3ML ~~LOC~~ SOPN: 0.2500 mg | PEN_INJECTOR | SUBCUTANEOUS | 0 refills | Status: DC

## 2023-02-17 MED ORDER — TRIAMCINOLONE ACETONIDE 55 MCG/ACT NA AERO
2.0000 | INHALATION_SPRAY | Freq: Every day | NASAL | 12 refills | Status: DC
  Filled 2023-02-17: qty 16.9, 30d supply, fill #0

## 2023-02-17 NOTE — Assessment & Plan Note (Signed)
 Managed with atorvastatin  10 mg daily.  Chronic - Continue atorvastatin  10 mg daily

## 2023-02-17 NOTE — Telephone Encounter (Signed)
 Pt informed by pharmacy that insurance does not cover the nasacort  and that patient can pay for it OTC for $16. Pt requesting a prescription for a nasal spray that is covered by  [employer] where she can pay a $4.00 copay.   Please assist pt further.

## 2023-02-17 NOTE — Assessment & Plan Note (Signed)
 Long-standing type 2 diabetes managed with insulin  (Humalog  15 units TID with meals, Lantus  80 units QHS) and metformin  500 mg BID. Recent A1c was 7.7%. Reports recurrent infections and congestion, potentially contributing to elevated glucose levels. Discussed adding Ozempic  for better glycemic control, weight management, cardiovascular benefits, and kidney protection. No family history of medullary thyroid  cancer noted. Chronic - Order updated labs including A1c - Start Ozempic  0.25 mg once weekly - Follow up in 6 weeks to assess response to Ozempic 

## 2023-02-17 NOTE — Assessment & Plan Note (Signed)
 Reports recurrent sinus infections and congestion, recently treated with antibiotics and albuterol  inhaler. Persistent symptoms suggest need for further evaluation. Discussed potential benefit of intranasal steroids like Nasacort  to reduce inflammation and improve drainage. - Referral to ENT for further evaluation - Prescribe Nasacort  nasal spray

## 2023-02-17 NOTE — Assessment & Plan Note (Signed)
 Managed with hydrochlorothiazide  25 mg daily. No recent blood pressure readings discussed. Chronic, well controlled today  - Continue hydrochlorothiazide  25 mg daily

## 2023-02-17 NOTE — Progress Notes (Signed)
 New patient visit   Patient: Jamie Massey   DOB: January 17, 1970   53 y.o. Female  MRN: [id number] Visit Date: 02/17/2023  Today's healthcare provider: Mimi Alt, MD   Chief Complaint  Patient presents with   Establish Care    Patient has limited income   Diabetes   Depression   Subjective    Jamie Massey is a 53 y.o. female who presents today as a new patient to establish care.   HPI     Establish Care    Additional comments: Patient has limited income      Last edited by Marnie Siren, CMA on 02/17/2023  9:36 AM.       Discussed the use of AI scribe software for clinical note transcription with the patient, who gave verbal consent to proceed.  History of Present Illness   Jamie Massey is a 53 year old female with type 2 diabetes and hyperlipidemia who presents to establish care as a new patient. She is accompanied by her daughter, Megan.  She has type 2 diabetes managed with long-acting insulin  (Lantus ) at 80 units at bedtime and fast-acting insulin  (Humalog ) on a sliding scale with meals. She also takes metformin , though there is some confusion about the dosage, with her last bottle indicating 1000 mg. She checks her blood sugar every morning, usually after coffee, and adjusts her insulin  accordingly. Her last A1c was reported as 7.7, which is higher than usual.  She experiences recurrent sinus infections and congestion, which she attributes to frequent colds caught from her grandchildren. These infections have led to bacterial infections affecting her esophagus. She has been on antibiotics and uses an albuterol  inhaler for congestion. Recently, a sinus infection progressed to a bacterial infection, causing ear pain and congestion.  She has hyperlipidemia managed with atorvastatin  10 mg daily. She also takes duloxetine  30 mg daily for pain and mood, and vistaril  25 mg at bedtime as needed.  Her family history includes diabetes on her father's side, COPD in  her mother, and cancer in her paternal grandmother and sister.  She is overweight and takes care of her grandchildren, which she believes contributes to her frequent colds. She has received three COVID vaccines and a tetanus vaccine in 2016. She also gets annual flu shots. No drug or alcohol use.        Reports A1c was 7.7 a little over a week ago   Reports her last tetanus was 07/2014  HAD 3 DOSES OF COVID VACCINE   SEES [facility] for [facility] exams, letter sent to request records    Past Medical History:  Diagnosis Date   Allergy    Anxiety    Arthritis    Asthma    Blood transfusion without reported diagnosis    Depression    Diabetes mellitus without complication (HCC)    DVT (deep venous thrombosis) (HCC)    GERD (gastroesophageal reflux disease)    Hypertension     Outpatient Medications Prior to Visit  Medication Sig   albuterol  (VENTOLIN  HFA) 108 (90 Base) MCG/ACT inhaler Inhale into the lungs as directed.   atorvastatin  (LIPITOR) 10 MG tablet TAKE 1 Tablet BY MOUTH ONCE EVERY EVENING FOR CHOLESTEROL   DULoxetine  (CYMBALTA ) 30 MG capsule TAKE 1 Capsule BY MOUTH ONCE DAILY FOR PAIN AND MOOD DOSE CHANGE   HUMALOG  KWIKPEN 100 UNIT/ML KwikPen INJECT 15 UNITS UNDER THE SKIN 3 TIMES DAILY WITH MEALS   hydrochlorothiazide  (HYDRODIURIL ) 25 MG tablet Take 25 mg  by mouth daily. for high blood pressure   IBUPROFEN  PO Take by mouth.   insulin  glargine (LANTUS ) 100 UNIT/ML injection Inject 80 Units into the skin at bedtime.   metFORMIN  (GLUCOPHAGE ) 500 MG tablet Take 500 mg by mouth 2 (two) times daily with a meal.   [DISCONTINUED] ALLERGY 10 MG tablet Take 10 mg by mouth daily.   [DISCONTINUED] azithromycin  (ZITHROMAX ) 250 MG tablet Take 1 tablet (250 mg total) by mouth daily. Take first 2 tablets together, then 1 every day until finished.   [DISCONTINUED] hydrOXYzine  (VISTARIL ) 25 MG capsule Take 1 capsule by mouth at bedtime as needed.   [DISCONTINUED] Insulin  Glargine (BASAGLAR   KWIKPEN) 100 UNIT/ML INJECT 80 UNITS UNDER THE SKIN EVERY NIGHT FOR DIABETES   [DISCONTINUED] promethazine -dextromethorphan (PROMETHAZINE -DM) 6.25-15 MG/5ML syrup Take 5 mLs by mouth 4 (four) times daily as needed.   [DISCONTINUED] ranitidine (ZANTAC) 150 MG tablet Take 1 tablet by mouth 2 (two) times daily.   No facility-administered medications prior to visit.    Past Surgical History:  Procedure Laterality Date   APPENDECTOMY     BREAST EXCISIONAL BIOPSY Right 1999   neg   BREAST SURGERY     BREAST SURGERY     CESAREAN SECTION     EXPLORATORY LAPAROTOMY     NASAL SINUS SURGERY     OOPHORECTOMY     SUPERIOR VENA CAVA ANGIOPLASTY / STENTING     Family Status  Relation Name Status   Mother  (Not Specified)   Sister  Deceased   Mat Aunt  Deceased   PGM  (Not Specified)   Other  (Not Specified)  No partnership data on file   Family History  Problem Relation Age of Onset   COPD Mother    Breast cancer Sister 50   Breast cancer Maternal Aunt 66   Ovarian cancer Paternal Grandmother    Deep vein thrombosis Other    Social History   Socioeconomic History   Marital status: Divorced    Spouse name: Not on file   Number of children: Not on file   Years of education: Not on file   Highest education level: Not on file  Occupational History   Not on file  Tobacco Use   Smoking status: Former   Smokeless tobacco: Never  Vaping Use   Vaping status: Never Used  Substance and Sexual Activity   Alcohol use: No   Drug use: No   Sexual activity: Not on file  Other Topics Concern   Not on file  Social History Narrative   Not on file   Social Drivers of Health   Financial Resource Strain: Not on file  Food Insecurity: Not on file  Transportation Needs: Not on file  Physical Activity: Not on file  Stress: Not on file  Social Connections: Not on file     Allergies  Allergen Reactions   Sumatriptan     Other Reaction(s): headache got worse   Codeine Other (See  Comments)   Pyridium [Phenazopyridine Hcl]    Cephalexin  Itching    Immunization History  Administered Date(s) Administered   Moderna Sars-Covid-2 Vaccination 03/10/2019, 04/06/2019, 06/22/2020    Health Maintenance  Topic Date Due   HEMOGLOBIN A1C  Never done   Pneumococcal Vaccine 56-39 Years old (1 of 2 - PCV) Never done   FOOT EXAM  Never done   OPHTHALMOLOGY EXAM  Never done   HIV Screening  Never done   Diabetic kidney evaluation - Urine ACR  Never  done   Hepatitis C Screening  Never done   DTaP/Tdap/Td (1 - Tdap) Never done   Colonoscopy  Never done   Zoster Vaccines- Shingrix (1 of 2) Never done   MAMMOGRAM  07/24/2019   COVID-19 Vaccine (4 - 2024-25 season) 09/08/2022   Diabetic kidney evaluation - eGFR measurement  12/05/2022   Cervical Cancer Screening (HPV/Pap Cotest)  06/23/2023   INFLUENZA VACCINE  Completed   HPV VACCINES  Aged Out    Patient Care Team: Mimi Alt, MD as PCP - General (Family Medicine) Burnie Cartwright, RN as Registered Nurse Arlette Benders, RN (Inactive) as Registered Nurse  Review of Systems  Last CBC Lab Results  Component Value Date   WBC  12/04/2021    SPECIMEN CLOTTED lisa thompson @2027  on 12/04/21 skl   HGB  12/04/2021    SPECIMEN CLOTTED lisa thompson @2027  on 12/04/21 skl   HCT  12/04/2021    SPECIMEN CLOTTED lisa thompson @2027  on 12/04/21 skl   MCV  12/04/2021    SPECIMEN CLOTTED lisa thompson @2027  on 12/04/21 skl   MCH  12/04/2021    SPECIMEN CLOTTED lisa thompson @2027  on 12/04/21 skl   RDW  12/04/2021    SPECIMEN CLOTTED lisa thompson @2027  on 12/04/21 skl   PLT  12/04/2021    SPECIMEN CLOTTED lisa thompson @2027  on 12/04/21 skl   Last metabolic panel Lab Results  Component Value Date   GLUCOSE 117 (H) 12/04/2021   NA QUANTITY NOT SUFFICIENT, UNABLE TO PERFORM TEST 12/04/2021   K QUANTITY NOT SUFFICIENT, UNABLE TO PERFORM TEST 12/04/2021   CL QUANTITY NOT SUFFICIENT, UNABLE TO PERFORM TEST  12/04/2021   CO2 QUANTITY NOT SUFFICIENT, UNABLE TO PERFORM TEST 12/04/2021   BUN QUANTITY NOT SUFFICIENT, UNABLE TO PERFORM TEST 12/04/2021   CREATININE QUANTITY NOT SUFFICIENT, UNABLE TO PERFORM TEST 12/04/2021   GFRNONAA QUANTITY NOT SUFFICIENT, UNABLE TO PERFORM TEST 12/04/2021   CALCIUM  QUANTITY NOT SUFFICIENT, UNABLE TO PERFORM TEST 12/04/2021   PROT QUANTITY NOT SUFFICIENT, UNABLE TO PERFORM TEST 12/04/2021   ALBUMIN QUANTITY NOT SUFFICIENT, UNABLE TO PERFORM TEST 12/04/2021   BILITOT QUANTITY NOT SUFFICIENT, UNABLE TO PERFORM TEST 12/04/2021   ALKPHOS QUANTITY NOT SUFFICIENT, UNABLE TO PERFORM TEST 12/04/2021   AST QUANTITY NOT SUFFICIENT, UNABLE TO PERFORM TEST 12/04/2021   ALT QUANTITY NOT SUFFICIENT, UNABLE TO PERFORM TEST 12/04/2021   ANIONGAP QUANTITY NOT SUFFICIENT, UNABLE TO PERFORM TEST 12/04/2021   Last lipids No results found for: "CHOL", "HDL", "LDLCALC", "LDLDIRECT", "TRIG", "CHOLHDL" Last hemoglobin A1c No results found for: "HGBA1C" Last thyroid  functions No results found for: "TSH", "T3TOTAL", "T4TOTAL", "THYROIDAB"      Objective    BP 127/81   Pulse 85   Wt 218 lb (98.9 kg)   BMI 38.62 kg/m  BP Readings from Last 3 Encounters:  02/17/23 127/81  04/06/22 119/79  12/04/21 (!) 160/99   Wt Readings from Last 3 Encounters:  02/17/23 218 lb (98.9 kg)  04/06/22 240 lb (108.9 kg)  12/04/21 224 lb 13.9 oz (102 kg)        Depression Screen    02/17/2023    9:47 AM  PHQ 2/9 Scores  PHQ - 2 Score 2  PHQ- 9 Score 7   No results found for any visits on 02/17/23.   Physical Exam Vitals reviewed.  Constitutional:      General: She is not in acute distress.    Appearance: Normal appearance. She is not ill-appearing, toxic-appearing or diaphoretic.  HENT:  Right Ear: A middle ear effusion is present.     Left Ear: A middle ear effusion is present.  Eyes:     Conjunctiva/sclera: Conjunctivae normal.  Cardiovascular:     Rate and Rhythm: Normal  rate and regular rhythm.     Pulses: Normal pulses.     Heart sounds: Normal heart sounds. No murmur heard.    No friction rub. No gallop.  Pulmonary:     Effort: Pulmonary effort is normal. No respiratory distress.     Breath sounds: Normal breath sounds. No stridor. No wheezing, rhonchi or rales.  Abdominal:     General: Bowel sounds are normal. There is no distension.     Palpations: Abdomen is soft.     Tenderness: There is no abdominal tenderness.  Musculoskeletal:     Right lower leg: No edema.     Left lower leg: No edema.  Skin:    Findings: No erythema or rash.  Neurological:     Mental Status: She is alert and oriented to person, place, and time.  Psychiatric:        Mood and Affect: Mood and affect normal.        Speech: Speech normal.        Behavior: Behavior normal. Behavior is cooperative.         Assessment & Plan      Problem List Items Addressed This Visit       Cardiovascular and Mediastinum   Hypertension associated with diabetes (HCC)   Managed with hydrochlorothiazide  25 mg daily. No recent blood pressure readings discussed. Chronic, well controlled today  - Continue hydrochlorothiazide  25 mg daily      Relevant Medications   insulin  glargine (LANTUS ) 100 UNIT/ML injection   Semaglutide ,0.25 or 0.5MG /DOS, (OZEMPIC , 0.25 OR 0.5 MG/DOSE,) 2 MG/3ML SOPN     Respiratory   Mild persistent asthma with (acute) exacerbation   Relevant Medications   albuterol  (VENTOLIN  HFA) 108 (90 Base) MCG/ACT inhaler   Chronic allergic rhinitis   Reports recurrent sinus infections and congestion, recently treated with antibiotics and albuterol  inhaler. Persistent symptoms suggest need for further evaluation. Discussed potential benefit of intranasal steroids like Nasacort  to reduce inflammation and improve drainage. - Referral to ENT for further evaluation - Prescribe Nasacort  nasal spray      Relevant Medications   triamcinolone  (NASACORT ) 55 MCG/ACT AERO nasal  inhaler   Other Relevant Orders   Ambulatory referral to ENT     Endocrine   Type 2 diabetes mellitus (HCC) - Primary   Long-standing type 2 diabetes managed with insulin  (Humalog  15 units TID with meals, Lantus  80 units QHS) and metformin  500 mg BID. Recent A1c was 7.7%. Reports recurrent infections and congestion, potentially contributing to elevated glucose levels. Discussed adding Ozempic  for better glycemic control, weight management, cardiovascular benefits, and kidney protection. No family history of medullary thyroid  cancer noted. Chronic - Order updated labs including A1c - Start Ozempic  0.25 mg once weekly - Follow up in 6 weeks to assess response to Ozempic        Relevant Medications   insulin  glargine (LANTUS ) 100 UNIT/ML injection   Semaglutide ,0.25 or 0.5MG /DOS, (OZEMPIC , 0.25 OR 0.5 MG/DOSE,) 2 MG/3ML SOPN   Other Relevant Orders   Urine microalbumin-creatinine with uACR   Hyperlipidemia associated with type 2 diabetes mellitus (HCC)   Managed with atorvastatin  10 mg daily.  Chronic - Continue atorvastatin  10 mg daily      Relevant Medications   insulin  glargine (LANTUS ) 100 UNIT/ML  injection   Semaglutide ,0.25 or 0.5MG /DOS, (OZEMPIC , 0.25 OR 0.5 MG/DOSE,) 2 MG/3ML SOPN     Nervous and Auditory   Chronic otitis media with effusion, bilateral   Relevant Orders   Ambulatory referral to ENT     Other   Obesity, morbid (HCC)   Relevant Medications   insulin  glargine (LANTUS ) 100 UNIT/ML injection   Semaglutide ,0.25 or 0.5MG /DOS, (OZEMPIC , 0.25 OR 0.5 MG/DOSE,) 2 MG/3ML SOPN   Other Visit Diagnoses       Congestion of both ears         Screening for colon cancer       Relevant Orders   Cologuard     Encounter for screening mammogram for malignant neoplasm of breast       Relevant Orders   MM 3D SCREENING MAMMOGRAM BILATERAL BREAST             Chronic Pain and Mood Disorder Managed with duloxetine  30 mg once daily. No recent changes in symptoms  discussed. - Continue duloxetine  30 mg once daily  General Health Maintenance Up to date on flu shots and COVID-19 vaccines. Last tetanus vaccine was in 2016. No recent colon cancer screening or mammogram. Prefers Cologuard for colon cancer screening due to convenience. - Order Cologuard for colon cancer screening - Order mammogram for breast cancer screening - Update tetanus vaccine     Return in about 6 weeks (around 03/31/2023) for DM.      Mimi Alt, MD  [facility] [phone] (phone) [phone] (fax)  [facility]

## 2023-02-18 LAB — SPECIMEN STATUS REPORT

## 2023-02-18 LAB — MICROALBUMIN / CREATININE URINE RATIO
Creatinine, Urine: 243.8 mg/dL
Microalb/Creat Ratio: 8 mg/g{creat} (ref 0–29)
Microalbumin, Urine: 20.4 ug/mL

## 2023-02-18 NOTE — Telephone Encounter (Signed)
Advised patient to contact her insurance to see what nasal sprays they cover.

## 2023-02-27 ENCOUNTER — Other Ambulatory Visit: Payer: Self-pay | Admitting: Family Medicine

## 2023-02-27 ENCOUNTER — Other Ambulatory Visit: Payer: Self-pay

## 2023-02-27 DIAGNOSIS — J309 Allergic rhinitis, unspecified: Secondary | ICD-10-CM

## 2023-02-27 DIAGNOSIS — E1165 Type 2 diabetes mellitus with hyperglycemia: Secondary | ICD-10-CM

## 2023-02-27 DIAGNOSIS — Z794 Long term (current) use of insulin: Secondary | ICD-10-CM

## 2023-02-27 NOTE — Telephone Encounter (Signed)
Copied from CRM [id number]. Topic: Clinical - Medication Refill >> Feb 27, 2023  5:09 PM Priscille Loveless wrote: Most Recent Primary Care Visit:   Medication:   albuterol (VENTOLIN HFA) 108 (90 Base) MCG/ACT inhaler  atorvastatin (LIPITOR) 10 MG tablet   DULoxetine (CYMBALTA) 30 MG capsule  HUMALOG KWIKPEN 100 UNIT/ML KwikPen   hydrochlorothiazide (HYDRODIURIL) 25 MG tablet  metFORMIN (GLUCOPHAGE) 500 MG tablet   Semaglutide,0.25 or 0.5MG /DOS, (OZEMPIC, 0.25 OR 0.5 MG/DOSE,) 2 MG/3ML SOPN  triamcinolone (NASACORT) 55 MCG/ACT AERO nasal inhaler  Has the patient contacted their pharmacy? Yes   Is this the correct pharmacy for this prescription? Yes If no, delete pharmacy and type the correct one. This is the patient's preferred pharmacy:   [facility] [address] [place] [address] Phone: [phone] Fax: [phone]   Has the prescription been filled recently? Yes  Is the patient out of the medication? Yes  Has the patient been seen for an appointment in the last year OR does the patient have an upcoming appointment? Yes  Can we respond through MyChart? No  Agent: Please be advised that Rx refills may take up to 3 business days. We ask that you follow-up with your pharmacy.

## 2023-02-28 NOTE — Telephone Encounter (Signed)
Requested medication (s) are due for refill today: routing for review  Requested medication (s) are on the active medication list: yes  Last refill:  multiple dates  Future visit scheduled: yes  Notes to clinic:  Unable to refill per protocol, last refill by another/historical provider.      Requested Prescriptions  Pending Prescriptions Disp Refills   albuterol (VENTOLIN HFA) 108 (90 Base) MCG/ACT inhaler      Sig: Inhale into the lungs as directed.     Pulmonology:  Beta Agonists 2 Failed - 02/28/2023  3:26 PM      Failed - Valid encounter within last 12 months    Recent Outpatient Visits   None     Future Appointments             In 1 month Simmons-Robinson, Makiera, MD [facility]            Passed - Last BP in normal range    BP Readings from Last 1 Encounters:  02/17/23 127/81         Passed - Last Heart Rate in normal range    Pulse Readings from Last 1 Encounters:  02/17/23 85          atorvastatin (LIPITOR) 10 MG tablet       Cardiovascular:  Antilipid - Statins Failed - 02/28/2023  3:26 PM      Failed - Valid encounter within last 12 months    Recent Outpatient Visits   None     Future Appointments             In 1 month Simmons-Robinson, Makiera, MD [facility]            Failed - Lipid Panel in normal range within the last 12 months    No results found for: "CHOL", "POCCHOL", "CHOLTOT" No results found for: "LDLCALC", "LDLC", "HIRISKLDL", "POCLDL", "LDLDIRECT", "REALLDLC", "TOTLDLC" No results found for: "HDL", "POCHDL" No results found for: "TRIG", "POCTRIG"       Passed - Patient is not pregnant       DULoxetine (CYMBALTA) 30 MG capsule       Psychiatry: Antidepressants - SNRI - duloxetine Failed - 02/28/2023  3:26 PM      Failed - Cr in normal range and within 360 days    Creatinine  Date Value Ref Range Status  01/25/2013 0.71 0.60 - 1.30 mg/dL Final    Creatinine, Ser  Date Value Ref Range Status  12/04/2021 QUANTITY NOT SUFFICIENT, UNABLE TO PERFORM TEST 0.44 - 1.00 mg/dL Final         Failed - eGFR is 30 or above and within 360 days    EGFR (African American)  Date Value Ref Range Status  01/25/2013 >60  Final   GFR calc Af Amer  Date Value Ref Range Status  04/26/2019 >60 >60 mL/min Final   EGFR (Non-African Amer.)  Date Value Ref Range Status  01/25/2013 >60  Final    Comment:    eGFR values <76mL/min/1.73 m2 may be an indication of chronic kidney disease (CKD). Calculated eGFR is useful in patients with stable renal function. The eGFR calculation will not be reliable in acutely ill patients when serum creatinine is changing rapidly. It is not useful in  patients on dialysis. The eGFR calculation may not be applicable to patients at the low and high extremes of body sizes, pregnant women, and vegetarians.    GFR, Estimated  Date  Value Ref Range Status  12/04/2021 QUANTITY NOT SUFFICIENT, UNABLE TO PERFORM TEST >60 mL/min Final         Failed - Valid encounter within last 6 months    Recent Outpatient Visits   None     Future Appointments             In 1 month Simmons-Robinson, Makiera, MD [facility]            Passed - Completed PHQ-2 or PHQ-9 in the last 360 days      Passed - Last BP in normal range    BP Readings from Last 1 Encounters:  02/17/23 127/81          HUMALOG KWIKPEN 100 UNIT/ML KwikPen 15 mL      Endocrinology:  Diabetes - Insulins Failed - 02/28/2023  3:26 PM      Failed - HBA1C is between 0 and 7.9 and within 180 days    No results found for: "HGBA1C", "LABA1C"       Failed - Valid encounter within last 6 months    Recent Outpatient Visits   None     Future Appointments             In 1 month Simmons-Robinson, Makiera, MD [facility]             hydrochlorothiazide (HYDRODIURIL) 25 MG tablet  3     Sig: Take 1 tablet (25 mg total) by mouth daily. for high blood pressure     Cardiovascular: Diuretics - Thiazide Failed - 02/28/2023  3:26 PM      Failed - Cr in normal range and within 180 days    Creatinine  Date Value Ref Range Status  01/25/2013 0.71 0.60 - 1.30 mg/dL Final   Creatinine, Ser  Date Value Ref Range Status  12/04/2021 QUANTITY NOT SUFFICIENT, UNABLE TO PERFORM TEST 0.44 - 1.00 mg/dL Final         Failed - K in normal range and within 180 days    Potassium  Date Value Ref Range Status  12/04/2021 QUANTITY NOT SUFFICIENT, UNABLE TO PERFORM TEST 3.5 - 5.1 mmol/L Final  01/25/2013 3.4 (L) 3.5 - 5.1 mmol/L Final         Failed - Na in normal range and within 180 days    Sodium  Date Value Ref Range Status  12/04/2021 QUANTITY NOT SUFFICIENT, UNABLE TO PERFORM TEST 135 - 145 mmol/L Final  01/25/2013 135 (L) 136 - 145 mmol/L Final         Failed - Valid encounter within last 6 months    Recent Outpatient Visits   None     Future Appointments             In 1 month Simmons-Robinson, Makiera, MD [place] [facility]            Passed - Last BP in normal range    BP Readings from Last 1 Encounters:  02/17/23 127/81          metFORMIN (GLUCOPHAGE) 500 MG tablet      Sig: Take 1 tablet (500 mg total) by mouth 2 (two) times daily with a meal.     Endocrinology:  Diabetes - Biguanides Failed - 02/28/2023  3:26 PM      Failed - Cr in normal range and within 360 days    Creatinine  Date Value Ref Range Status  01/25/2013  0.71 0.60 - 1.30 mg/dL Final   Creatinine, Ser  Date Value Ref Range Status  12/04/2021 QUANTITY NOT SUFFICIENT, UNABLE TO PERFORM TEST 0.44 - 1.00 mg/dL Final         Failed - HBA1C is between 0 and 7.9 and within 180 days    No results found for: "HGBA1C", "LABA1C"       Failed - eGFR in normal range and within 360 days    EGFR (African American)  Date Value Ref Range Status  01/25/2013 >60  Final   GFR  calc Af Amer  Date Value Ref Range Status  04/26/2019 >60 >60 mL/min Final   EGFR (Non-African Amer.)  Date Value Ref Range Status  01/25/2013 >60  Final    Comment:    eGFR values <19mL/min/1.73 m2 may be an indication of chronic kidney disease (CKD). Calculated eGFR is useful in patients with stable renal function. The eGFR calculation will not be reliable in acutely ill patients when serum creatinine is changing rapidly. It is not useful in  patients on dialysis. The eGFR calculation may not be applicable to patients at the low and high extremes of body sizes, pregnant women, and vegetarians.    GFR, Estimated  Date Value Ref Range Status  12/04/2021 QUANTITY NOT SUFFICIENT, UNABLE TO PERFORM TEST >60 mL/min Final         Failed - B12 Level in normal range and within 720 days    No results found for: "VITAMINB12"       Failed - Valid encounter within last 6 months    Recent Outpatient Visits   None     Future Appointments             In 1 month Simmons-Robinson, Makiera, MD [facility]            Failed - CBC within normal limits and completed in the last 12 months    WBC  Date Value Ref Range Status  12/04/2021  4.0 - 10.5 K/uL Final   SPECIMEN CLOTTED lisa thompson @2027  on 12/04/21 skl   RBC  Date Value Ref Range Status  12/04/2021  3.87 - 5.11 MIL/uL Final   SPECIMEN CLOTTED lisa thompson @2027  on 12/04/21 skl   Hemoglobin  Date Value Ref Range Status  12/04/2021  12.0 - 15.0 g/dL Final   SPECIMEN CLOTTED lisa thompson @2027  on 12/04/21 skl   HGB  Date Value Ref Range Status  01/25/2013 11.7 (L) 12.0 - 16.0 g/dL Final   HCT  Date Value Ref Range Status  12/04/2021  36.0 - 46.0 % Final   SPECIMEN CLOTTED lisa thompson @2027  on 12/04/21 skl  01/25/2013 34.8 (L) 35.0 - 47.0 % Final   MCHC  Date Value Ref Range Status  12/04/2021  30.0 - 36.0 g/dL Final   SPECIMEN CLOTTED lisa thompson @2027  on 12/04/21 skl    MCH  Date Value Ref Range Status  12/04/2021  26.0 - 34.0 pg Final   SPECIMEN CLOTTED lisa thompson @2027  on 12/04/21 skl   MCV  Date Value Ref Range Status  12/04/2021  80.0 - 100.0 fL Final   SPECIMEN CLOTTED lisa thompson @2027  on 12/04/21 skl  01/25/2013 93 80 - 100 fL Final   No results found for: "PLTCOUNTKUC", "LABPLAT", "POCPLA" RDW  Date Value Ref Range Status  12/04/2021  11.5 - 15.5 % Final   SPECIMEN CLOTTED lisa thompson @2027  on 12/04/21 skl  01/25/2013 15.4 (H) 11.5 - 14.5 %  Final          Semaglutide,0.25 or 0.5MG /DOS, (OZEMPIC, 0.25 OR 0.5 MG/DOSE,) 2 MG/3ML SOPN 3 mL 0    Sig: Inject 0.25 mg into the skin once a week.     Endocrinology:  Diabetes - GLP-1 Receptor Agonists - semaglutide Failed - 02/28/2023  3:26 PM      Failed - HBA1C in normal range and within 180 days    No results found for: "HGBA1C", "LABA1C"       Failed - Cr in normal range and within 360 days    Creatinine  Date Value Ref Range Status  01/25/2013 0.71 0.60 - 1.30 mg/dL Final   Creatinine, Ser  Date Value Ref Range Status  12/04/2021 QUANTITY NOT SUFFICIENT, UNABLE TO PERFORM TEST 0.44 - 1.00 mg/dL Final         Failed - Valid encounter within last 6 months    Recent Outpatient Visits   None     Future Appointments             In 1 month Simmons-Robinson, Makiera, MD [place] [facility]             triamcinolone (NASACORT) 55 MCG/ACT AERO nasal inhaler 16.9 g 12    Sig: Place 2 sprays into the nose daily.     Ear, Nose, and Throat: Nasal Preparations - Corticosteroids Failed - 02/28/2023  3:26 PM      Failed - Valid encounter within last 12 months    Recent Outpatient Visits   None     Future Appointments             In 1 month Simmons-Robinson, Makiera, MD [facility]

## 2023-03-03 ENCOUNTER — Other Ambulatory Visit: Payer: Self-pay

## 2023-03-03 MED ORDER — DULOXETINE HCL 30 MG PO CPEP
30.0000 mg | ORAL_CAPSULE | Freq: Every day | ORAL | 1 refills | Status: DC
  Filled 2023-03-03: qty 90, 90d supply, fill #0

## 2023-03-03 MED ORDER — ATORVASTATIN CALCIUM 10 MG PO TABS
10.0000 mg | ORAL_TABLET | Freq: Every day | ORAL | 1 refills | Status: DC
  Filled 2023-03-03 – 2023-04-07 (×2): qty 90, 90d supply, fill #0

## 2023-03-03 MED ORDER — HYDROCHLOROTHIAZIDE 25 MG PO TABS
25.0000 mg | ORAL_TABLET | Freq: Every day | ORAL | 1 refills | Status: DC
  Filled 2023-03-03 – 2023-04-07 (×2): qty 90, 90d supply, fill #0
  Filled 2023-07-07: qty 90, 90d supply, fill #1

## 2023-03-03 MED ORDER — TRIAMCINOLONE ACETONIDE 55 MCG/ACT NA AERO
2.0000 | INHALATION_SPRAY | Freq: Every day | NASAL | 12 refills | Status: AC
  Filled 2023-03-03 – 2023-09-19 (×5): qty 16.9, 30d supply, fill #0

## 2023-03-03 MED ORDER — METFORMIN HCL 500 MG PO TABS
500.0000 mg | ORAL_TABLET | Freq: Two times a day (BID) | ORAL | 1 refills | Status: DC
  Filled 2023-03-03 – 2023-04-07 (×3): qty 180, 90d supply, fill #0
  Filled 2023-06-09 – 2023-07-07 (×2): qty 180, 90d supply, fill #1

## 2023-03-03 MED ORDER — HUMALOG KWIKPEN 100 UNIT/ML ~~LOC~~ SOPN
15.0000 [IU] | PEN_INJECTOR | Freq: Three times a day (TID) | SUBCUTANEOUS | 6 refills | Status: DC
  Filled 2023-03-03 – 2023-08-15 (×2): qty 15, 34d supply, fill #0
  Filled 2023-09-19 – 2023-10-20 (×6): qty 15, 34d supply, fill #1

## 2023-03-06 ENCOUNTER — Other Ambulatory Visit: Payer: Self-pay | Admitting: Family Medicine

## 2023-03-06 ENCOUNTER — Other Ambulatory Visit: Payer: Self-pay

## 2023-03-06 DIAGNOSIS — E1165 Type 2 diabetes mellitus with hyperglycemia: Secondary | ICD-10-CM

## 2023-03-07 ENCOUNTER — Telehealth: Payer: Self-pay | Admitting: Family Medicine

## 2023-03-07 ENCOUNTER — Other Ambulatory Visit: Payer: Self-pay

## 2023-03-07 NOTE — Telephone Encounter (Signed)
 Copied from CRM [id number]. Topic: Clinical - Medication Refill >> Mar 07, 2023  4:41 PM Alessandra Bevels wrote: Most Recent Primary Care Visit:   Medication:   Trueplus Pen Needles 100 Count 0.23 mm X 32 g X5/32  Accu Check Guide Test Strips 50 2 in a box  Rx #: [id number]  triamcinolone (NASACORT) 55 MCG/ACT AERO nasal inhaler [[id number]]  DISCONTINUED   Rx #: [id number]  Semaglutide,0.25 or 0.5MG /DOS, (OZEMPIC, 0.25 OR 0.5 MG/DOSE,) 2 MG/3ML SOPN [[id number]]    Has the patient contacted their pharmacy? Yes (Agent: If no, request that the patient contact the pharmacy for the refill. If patient does not wish to contact the pharmacy document the reason why and proceed with request.) (Agent: If yes, when and what did the pharmacy advise?)  Is this the correct pharmacy for this prescription? Yes If no, delete pharmacy and type the correct one.  This is the patient's preferred pharmacy:  [facility] [address] [place] [address] Phone: [phone] Fax: [phone]   Has the prescription been filled recently? Yes  Is the patient out of the medication? Yes  Has the patient been seen for an appointment in the last year OR does the patient have an upcoming appointment? Yes  Can we respond through MyChart? Yes  Agent: Please be advised that Rx refills may take up to 3 business days. We ask that you follow-up with your pharmacy.

## 2023-03-07 NOTE — Telephone Encounter (Signed)
 Requested medication (s) are due for refill today:   Yes  Requested medication (s) are on the active medication list:   Yes  Future visit scheduled:   Yes 3/31 with Dr. Roxan Hockey      Established care with her on 02/17/2023   Last ordered: 03/06/2023 3 ml, 0 refills  Unable to refill because labs are due.   None were done during initial OV on 2/10.     Requested Prescriptions  Pending Prescriptions Disp Refills   OZEMPIC, 0.25 OR 0.5 MG/DOSE, 2 MG/3ML SOPN [Pharmacy Med Name: Semaglutide,0.25 or 0.5MG /DOS, (OZEMPIC, 0.25 OR 0.5 MG/DOSE,) 2 MG/3ML Solution Pen-injector] 3 mL 0    Sig: Inject 0.25 mg into the skin once a week.     Endocrinology:  Diabetes - GLP-1 Receptor Agonists - semaglutide Failed - 03/07/2023  2:07 PM      Failed - HBA1C in normal range and within 180 days    No results found for: "HGBA1C", "LABA1C"       Failed - Cr in normal range and within 360 days    Creatinine  Date Value Ref Range Status  01/25/2013 0.71 0.60 - 1.30 mg/dL Final   Creatinine, Ser  Date Value Ref Range Status  12/04/2021 QUANTITY NOT SUFFICIENT, UNABLE TO PERFORM TEST 0.44 - 1.00 mg/dL Final         Failed - Valid encounter within last 6 months    Recent Outpatient Visits   None     Future Appointments             In 1 month Simmons-Robinson, Makiera, MD [facility]

## 2023-03-09 ENCOUNTER — Other Ambulatory Visit: Payer: Self-pay

## 2023-03-10 ENCOUNTER — Other Ambulatory Visit: Payer: Self-pay

## 2023-03-10 NOTE — Telephone Encounter (Signed)
 Rx has been sent to the provider to be reviewed

## 2023-03-10 NOTE — Telephone Encounter (Signed)
 LOV: 02/17/2023  NOF: 04/07/2023  LAB: last Glucose 04/06/2023              Pt called and requested a medication refill on these medications.  *Trueplus Pen Needles 100 Count 0.23 mm X 32 g X5/32   *Accu Check Guide Test Strips 50 2 in a box   *triamcinolone (NASACORT) 55 MCG/ACT AERO nasal inhaler [[id number]]  DISCONTINUED      - insurance dont cover preferred drug: Atrovent HFA inhaler   *Semaglutide,0.25 or 0.5MG /DOS, (OZEMPIC, 0.25 OR 0.5 MG/DOSE,) 2 MG/3ML SOPN    Pt stated she felt a little sick the first 3 days after taking the medication, now she is doing better

## 2023-03-11 ENCOUNTER — Other Ambulatory Visit: Payer: Self-pay

## 2023-03-11 ENCOUNTER — Telehealth: Payer: Self-pay | Admitting: Family Medicine

## 2023-03-11 ENCOUNTER — Other Ambulatory Visit (HOSPITAL_COMMUNITY): Payer: Self-pay

## 2023-03-11 MED ORDER — REFRESH OPTIVE 0.5-0.9 % OP SOLN
1.0000 [drp] | Freq: Four times a day (QID) | OPHTHALMIC | 2 refills | Status: DC
Start: 2022-07-24 — End: 2023-09-01
  Filled 2023-04-07: qty 15, 75d supply, fill #0

## 2023-03-11 MED ORDER — TRUEPLUS PEN NEEDLES 32G X 4 MM MISC
1.0000 | Freq: Four times a day (QID) | 1 refills | Status: AC
  Filled 2023-03-11: qty 100, 25d supply, fill #0
  Filled 2023-05-07: qty 100, 25d supply, fill #1
  Filled 2023-06-09: qty 100, 25d supply, fill #2
  Filled 2023-07-07: qty 100, 25d supply, fill #3
  Filled 2023-08-11: qty 400, 25d supply, fill #4

## 2023-03-11 MED ORDER — ALBUTEROL SULFATE (2.5 MG/3ML) 0.083% IN NEBU: 3.0000 mL | INHALATION_SOLUTION | RESPIRATORY_TRACT | 12 refills | Status: AC

## 2023-03-11 MED ORDER — ACCU-CHEK SOFTCLIX LANCETS MISC
1.0000 | Freq: Three times a day (TID) | 10 refills | Status: AC
  Filled 2023-04-07: qty 100, 34d supply, fill #0
  Filled 2023-05-07: qty 100, 34d supply, fill #1
  Filled 2023-06-09: qty 100, 34d supply, fill #2
  Filled 2023-07-07: qty 100, 34d supply, fill #3
  Filled 2023-08-11: qty 100, 34d supply, fill #4
  Filled 2023-09-10: qty 100, 34d supply, fill #5
  Filled 2023-10-20: qty 100, 34d supply, fill #6
  Filled 2023-11-19: qty 100, 34d supply, fill #7
  Filled 2023-12-08 – 2023-12-15 (×2): qty 100, 34d supply, fill #8
  Filled 2024-01-28: qty 100, 34d supply, fill #9

## 2023-03-11 MED ORDER — IBUPROFEN 800 MG PO TABS
800.0000 mg | ORAL_TABLET | Freq: Three times a day (TID) | ORAL | 0 refills | Status: DC | PRN
  Filled 2023-03-11 – 2023-04-07 (×2): qty 100, 34d supply, fill #0

## 2023-03-11 MED ORDER — GLUCOSE BLOOD VI STRP
ORAL_STRIP | 12 refills | Status: DC
  Filled 2023-03-11: qty 100, fill #0
  Filled 2023-03-11: qty 100, 30d supply, fill #0
  Filled 2023-04-07: qty 100, 30d supply, fill #1
  Filled 2023-05-07: qty 100, 30d supply, fill #2
  Filled 2023-06-09: qty 100, 30d supply, fill #3
  Filled 2023-07-07: qty 100, 30d supply, fill #4
  Filled 2023-08-11: qty 100, 30d supply, fill #5
  Filled 2023-09-10: qty 100, 30d supply, fill #6
  Filled 2023-10-18: qty 100, 30d supply, fill #7
  Filled 2023-11-15: qty 100, 30d supply, fill #8
  Filled 2023-12-15: qty 100, 30d supply, fill #9
  Filled 2024-01-10: qty 100, 30d supply, fill #10

## 2023-03-11 MED ORDER — MOMETASONE FURO-FORMOTEROL FUM 200-5 MCG/ACT IN AERO: 2.0000 | INHALATION_SPRAY | Freq: Two times a day (BID) | RESPIRATORY_TRACT | 1 refills | Status: DC

## 2023-03-11 MED ORDER — INSULIN LISPRO (1 UNIT DIAL) 100 UNIT/ML (KWIKPEN): 15.0000 [IU] | PEN_INJECTOR | Freq: Three times a day (TID) | SUBCUTANEOUS | 4 refills | Status: DC

## 2023-03-11 MED ORDER — CYANOCOBALAMIN 1000 MCG/ML IJ SOLN: 1000.0000 ug | INTRAMUSCULAR | 1 refills | Status: DC

## 2023-03-11 MED ORDER — INSULIN GLARGINE 100 UNIT/ML SOLOSTAR PEN
80.0000 [IU] | PEN_INJECTOR | Freq: Every day | SUBCUTANEOUS | 2 refills | Status: DC
  Filled 2023-04-07: qty 72, 90d supply, fill #0
  Filled 2023-07-07: qty 72, 90d supply, fill #1

## 2023-03-11 MED ORDER — TRUEPLUS 5-BEVEL PEN NEEDLES 32G X 4 MM MISC
1.0000 | Freq: Every day | 3 refills | Status: DC
Start: 2022-05-01 — End: 2023-11-19
  Filled 2023-03-11: qty 100, 20d supply, fill #0

## 2023-03-11 MED FILL — Semaglutide Soln Pen-inj 0.25 or 0.5 MG/DOSE (2 MG/3ML): SUBCUTANEOUS | 56 days supply | Qty: 3 | Fill #0 | Status: AC

## 2023-03-11 NOTE — Telephone Encounter (Signed)
 Called and was able to inform the pt the requested medication has been approved and sent to the pharmacy on file. She verbally stated she understood.

## 2023-03-11 NOTE — Telephone Encounter (Signed)
 Copied from CRM [id number]. Topic: Clinical - Prescription Issue >> Mar 11, 2023 11:50 AM Hamdi H wrote: Reason for CRM: Pt states her insurance does not cover Nasacort and the nasal spray needs to be in generic form. She would like that to be sent over to her pharmacy as soon as possible since she's been waiting for over a week for this to be sent.

## 2023-03-11 NOTE — Telephone Encounter (Signed)
 Message has been sent to the provider to be reviewed.

## 2023-03-13 ENCOUNTER — Other Ambulatory Visit: Payer: Self-pay

## 2023-04-07 ENCOUNTER — Other Ambulatory Visit: Payer: Self-pay | Admitting: Family Medicine

## 2023-04-07 ENCOUNTER — Other Ambulatory Visit (HOSPITAL_BASED_OUTPATIENT_CLINIC_OR_DEPARTMENT_OTHER): Payer: Self-pay

## 2023-04-07 ENCOUNTER — Ambulatory Visit: Payer: Medicaid Other | Admitting: Family Medicine

## 2023-04-07 ENCOUNTER — Other Ambulatory Visit: Payer: Self-pay

## 2023-04-07 DIAGNOSIS — J309 Allergic rhinitis, unspecified: Secondary | ICD-10-CM

## 2023-04-07 MED ORDER — TRIAMCINOLONE ACETONIDE 0.1 % EX CREA
1.0000 | TOPICAL_CREAM | Freq: Two times a day (BID) | CUTANEOUS | 0 refills | Status: DC
  Filled 2023-04-07: qty 30, 15d supply, fill #0

## 2023-04-07 NOTE — Telephone Encounter (Signed)
 Copied from CRM [id number]. Topic: Clinical - Medication Refill >> Apr 07, 2023  2:51 PM Franchot Heidelberg wrote: Most Recent Primary Care Visit:  Provider: Ronnald Ramp  Department: [facility]  Visit Type: NEW PATIENT  Date: 02/17/2023  Medication: Seeking triamcinolone ointment   Has the patient contacted their pharmacy? Yes (Agent: If no, request that the patient contact the pharmacy for the refill. If patient does not wish to contact the pharmacy document the reason why and proceed with request.) (Agent: If yes, when and what did the pharmacy advise?)  Is this the correct pharmacy for this prescription? Yes If no, delete pharmacy and type the correct one.  This is the patient's preferred pharmacy:  [facility] [address] [place] [address] Phone: [phone] Fax: [phone]   Has the prescription been filled recently? Yes  Is the patient out of the medication? Yes  Has the patient been seen for an appointment in the last year OR does the patient have an upcoming appointment? Yes  Can we respond through MyChart? Yes  Agent: Please be advised that Rx refills may take up to 3 business days. We ask that you follow-up with your pharmacy.

## 2023-04-08 ENCOUNTER — Other Ambulatory Visit: Payer: Self-pay

## 2023-04-09 ENCOUNTER — Other Ambulatory Visit: Payer: Self-pay

## 2023-04-10 ENCOUNTER — Other Ambulatory Visit: Payer: Self-pay

## 2023-04-11 ENCOUNTER — Other Ambulatory Visit: Payer: Self-pay

## 2023-04-11 ENCOUNTER — Telehealth: Payer: Self-pay | Admitting: Family Medicine

## 2023-04-11 NOTE — Telephone Encounter (Signed)
 LRF 03/03/23 with 13 refills remaining. Rx not sent

## 2023-04-11 NOTE — Telephone Encounter (Signed)
 [facility] is requesting refill triamcinolone (NASACORT) 55 MCG/ACT AERO nasal inhaler   Please advise

## 2023-04-23 ENCOUNTER — Encounter: Payer: Self-pay | Admitting: Family Medicine

## 2023-04-23 ENCOUNTER — Ambulatory Visit: Admitting: Family Medicine

## 2023-04-23 ENCOUNTER — Other Ambulatory Visit (HOSPITAL_COMMUNITY): Payer: Self-pay

## 2023-04-23 ENCOUNTER — Other Ambulatory Visit: Payer: Self-pay

## 2023-04-23 VITALS — BP 112/70 | HR 84 | Resp 18 | Ht 63.0 in | Wt 217.0 lb

## 2023-04-23 DIAGNOSIS — Z794 Long term (current) use of insulin: Secondary | ICD-10-CM | POA: Diagnosis not present

## 2023-04-23 DIAGNOSIS — R251 Tremor, unspecified: Secondary | ICD-10-CM | POA: Insufficient documentation

## 2023-04-23 DIAGNOSIS — E1140 Type 2 diabetes mellitus with diabetic neuropathy, unspecified: Secondary | ICD-10-CM

## 2023-04-23 DIAGNOSIS — E1169 Type 2 diabetes mellitus with other specified complication: Secondary | ICD-10-CM

## 2023-04-23 DIAGNOSIS — E1159 Type 2 diabetes mellitus with other circulatory complications: Secondary | ICD-10-CM | POA: Diagnosis not present

## 2023-04-23 DIAGNOSIS — I152 Hypertension secondary to endocrine disorders: Secondary | ICD-10-CM

## 2023-04-23 DIAGNOSIS — E1165 Type 2 diabetes mellitus with hyperglycemia: Secondary | ICD-10-CM | POA: Diagnosis not present

## 2023-04-23 DIAGNOSIS — R5383 Other fatigue: Secondary | ICD-10-CM | POA: Diagnosis not present

## 2023-04-23 DIAGNOSIS — E785 Hyperlipidemia, unspecified: Secondary | ICD-10-CM

## 2023-04-23 DIAGNOSIS — M65321 Trigger finger, right index finger: Secondary | ICD-10-CM

## 2023-04-23 DIAGNOSIS — F419 Anxiety disorder, unspecified: Secondary | ICD-10-CM | POA: Diagnosis not present

## 2023-04-23 DIAGNOSIS — D508 Other iron deficiency anemias: Secondary | ICD-10-CM | POA: Diagnosis not present

## 2023-04-23 DIAGNOSIS — E538 Deficiency of other specified B group vitamins: Secondary | ICD-10-CM

## 2023-04-23 MED ORDER — SEMAGLUTIDE (1 MG/DOSE) 4 MG/3ML ~~LOC~~ SOPN
1.0000 mg | PEN_INJECTOR | SUBCUTANEOUS | 2 refills | Status: DC
Start: 2023-04-23 — End: 2023-07-30
  Filled 2023-04-23: qty 3, 28d supply, fill #0
  Filled 2023-05-29: qty 3, 28d supply, fill #1
  Filled 2023-06-09 – 2023-06-26 (×2): qty 3, 28d supply, fill #2

## 2023-04-23 NOTE — Progress Notes (Addendum)
 Established patient visit   Patient: Jamie Massey   DOB: June 16, 1970   53 y.o. Female  MRN: [id number] Visit Date: 04/23/2023  Today's healthcare provider: Ronnald Ramp, MD   Chief Complaint  Patient presents with   Follow-up   Diabetes    Pt reports eyes have been dry and bothering her, and occasionally very thirsty   Has sugar log at home that varies from 150-230 Pt reports eating whatever she wants, no limits on carbs/sugars   Medication Refill    Needs ozempic refill and clarification on instructions, also states she has not noticed loss of appetite    Intense pain in right pointer finger that makes it hard for pt to bend   Subjective     HPI     Diabetes    Additional comments: Pt reports eyes have been dry and bothering her, and occasionally very thirsty   Has sugar log at home that varies from 150-230 Pt reports eating whatever she wants, no limits on carbs/sugars        Medication Refill    Additional comments: Needs ozempic refill and clarification on instructions, also states she has not noticed loss of appetite    Intense pain in right pointer finger that makes it hard for pt to bend      Last edited by Allayne Stack on 04/23/2023  3:27 PM.       Discussed the use of AI scribe software for clinical note transcription with the patient, who gave verbal consent to proceed.  History of Present Illness The patient is a 53 year old with diabetes who presents for a follow-up visit with her daughter, Jamie Massey who helps to provide some of the history.   She experiences dry eyes and increased thirst, with blood sugar levels fluctuating between 150 and 230 mg/dL. She is not restricting her carbohydrate or sugar intake. Her diabetes management includes Humalog 15 units three times daily, Lantus 80 units daily, metformin 500 mg twice daily, and Ozempic. She requests a refill of Ozempic, noting no loss of appetite with its use. For hyperlipidemia, she is  on Lipitor 10 mg daily.  She reports pain in her right index finger, which makes it difficult to bend, particularly in the morning. The pain is described as a 'catch' in the joint. She has been taking ibuprofen 800 mg every eight hours for pain, sometimes adding Tylenol when the pain is severe. She also experiences general aches and pains, including in her shoulder, which has been previously treated with injections that were ineffective.  She and her daughter state that she has not been able to work for over 10 years due to her health conditions. They are requesting a letter of medical support to help with continuing her food assistance program. The patient also has questions on how she can apply for disability. She states that she has been denied multiple times despite her health conditions limiting her from being able to work. Patient states she does not have the finances to be able to work with lawyers and appeal the previous denials.   She mentions experiencing tremors, primarily in her right  arm, and sometimes in her head and foot. These tremors are not associated with any specific activity. She has a history of depression and is currently on duloxetine, which she believes helps with her neuropathy and bodily pain.  She has a history of low vitamin B12 levels and has been feeling tired lately, which she attributes  to her metformin use. She has not worked in over ten years due to her health issues, including chronic pain and fatigue.  Her hypertension is well-controlled with hydrochlorothiazide 25 mg daily. She uses Opdivo eye drops as directed by her eye specialist.     Past Medical History:  Diagnosis Date   Allergy    Anxiety    Arthritis    Asthma    Blood transfusion without reported diagnosis    Depression    Diabetes mellitus without complication (HCC)    DVT (deep venous thrombosis) (HCC)    GERD (gastroesophageal reflux disease)    Hypertension     Medications: Outpatient  Medications Prior to Visit  Medication Sig   Accu-Chek Softclix Lancets lancets Use 3 (three) times daily for blood sugar testing   albuterol (PROVENTIL) (2.5 MG/3ML) 0.083% nebulizer solution Inhale 3 mLs into the lungs every 4 (four) hours for wheezing or shortness of breath   albuterol (VENTOLIN HFA) 108 (90 Base) MCG/ACT inhaler Inhale into the lungs as directed.   atorvastatin (LIPITOR) 10 MG tablet Take 1 tablet (10 mg total) by mouth daily.   carboxymethylcellul-glycerin (OPTIVE) 0.5-0.9 % ophthalmic solution Place 1 drop into both eyes 3 (three) to 4 (four) times daily.   cyanocobalamin (VITAMIN B12) 1000 MCG/ML injection Inject 1,000 mLs (1,000,000 mcg total) into the muscle once a week for 4 weeks, then once a month for vitamin B12 deficiency   DULoxetine (CYMBALTA) 30 MG capsule Take 1 capsule (30 mg total) by mouth daily.   glucose blood test strip Use as instructed   HUMALOG KWIKPEN 100 UNIT/ML KwikPen Inject 15 Units into the skin 3 (three) times daily.   hydrochlorothiazide (HYDRODIURIL) 25 MG tablet Take 1 tablet (25 mg total) by mouth daily. for high blood pressure   ibuprofen (ADVIL) 800 MG tablet Take 1 tablet (800 mg total) by mouth every 8 (eight) hours as needed for pain. Take with food.   IBUPROFEN PO Take by mouth.   insulin glargine (LANTUS) 100 UNIT/ML injection Inject 80 Units into the skin at bedtime.   insulin glargine (LANTUS) 100 UNIT/ML Solostar Pen Inject 80 Units into the skin daily for diabetes   insulin lispro (HUMALOG) 100 UNIT/ML KwikPen Inject 15 Units into the skin 3 (three) times daily with meals   Insulin Pen Needle (TRUEPLUS 5-BEVEL PEN NEEDLES) 32G X 4 MM MISC Use 5 (five) times daily with insulin injections.   Insulin Pen Needle (TRUEPLUS PEN NEEDLES) 32G X 4 MM MISC Use in the morning, at noon, in the evening, and at bedtime.   metFORMIN (GLUCOPHAGE) 500 MG tablet Take 1 tablet (500 mg total) by mouth 2 (two) times daily with a meal.    mometasone-formoterol (DULERA) 200-5 MCG/ACT AERO Inhale 2 puffs into the lungs 2 (two) times daily for asthma. Rinse and spit with water after each use.   triamcinolone (NASACORT) 55 MCG/ACT AERO nasal inhaler Place 2 sprays into the nose daily.   triamcinolone cream (KENALOG) 0.1 % Apply 1 Application topically 2 (two) times daily.   [DISCONTINUED] Semaglutide,0.25 or 0.5MG /DOS, (OZEMPIC, 0.25 OR 0.5 MG/DOSE,) 2 MG/3ML SOPN Inject 0.25 mg into the skin once a week.   No facility-administered medications prior to visit.    Review of Systems  Last metabolic panel Lab Results  Component Value Date   GLUCOSE 117 (H) 12/04/2021   NA QUANTITY NOT SUFFICIENT, UNABLE TO PERFORM TEST 12/04/2021   K QUANTITY NOT SUFFICIENT, UNABLE TO PERFORM TEST 12/04/2021   CL  QUANTITY NOT SUFFICIENT, UNABLE TO PERFORM TEST 12/04/2021   CO2 QUANTITY NOT SUFFICIENT, UNABLE TO PERFORM TEST 12/04/2021   BUN QUANTITY NOT SUFFICIENT, UNABLE TO PERFORM TEST 12/04/2021   CREATININE QUANTITY NOT SUFFICIENT, UNABLE TO PERFORM TEST 12/04/2021   GFRNONAA QUANTITY NOT SUFFICIENT, UNABLE TO PERFORM TEST 12/04/2021   CALCIUM QUANTITY NOT SUFFICIENT, UNABLE TO PERFORM TEST 12/04/2021   PROT QUANTITY NOT SUFFICIENT, UNABLE TO PERFORM TEST 12/04/2021   ALBUMIN QUANTITY NOT SUFFICIENT, UNABLE TO PERFORM TEST 12/04/2021   BILITOT QUANTITY NOT SUFFICIENT, UNABLE TO PERFORM TEST 12/04/2021   ALKPHOS QUANTITY NOT SUFFICIENT, UNABLE TO PERFORM TEST 12/04/2021   AST QUANTITY NOT SUFFICIENT, UNABLE TO PERFORM TEST 12/04/2021   ALT QUANTITY NOT SUFFICIENT, UNABLE TO PERFORM TEST 12/04/2021   ANIONGAP QUANTITY NOT SUFFICIENT, UNABLE TO PERFORM TEST 12/04/2021    Last hemoglobin A1c No results found for: "HGBA1C"      Objective    BP 112/70   Pulse 84   Resp 18   Ht 5\' 3"  (1.6 m)   Wt 217 lb (98.4 kg)   SpO2 99%   BMI 38.44 kg/m  BP Readings from Last 3 Encounters:  04/23/23 112/70  02/17/23 127/81  04/06/22 119/79    Wt Readings from Last 3 Encounters:  04/23/23 217 lb (98.4 kg)  02/17/23 218 lb (98.9 kg)  04/06/22 240 lb (108.9 kg)        Physical Exam  General: Alert, no acute distress Cardio: Normal S1 and S2, RRR, no r/m/g Pulm: CTAB, normal work of breathing ABD: soft, abdomen is not distended, there is no tenderness to palpation, normal BS  Extremities: right index finger with no swelling nor erythema, patient has tenderness with manipulation/flexion of the first digit on right hand  NEURO: tremor of RUE, normal LUE, head tremor noted while patient seated on exam table but not while patient seated in exam room chair, patient displays trouble with word finding intermittently and difficulty with recalling events and reasons for medical treatment changes    No results found for any visits on 04/23/23.  Assessment & Plan     Problem List Items Addressed This Visit       Cardiovascular and Mediastinum   Hypertension associated with diabetes (HCC)   Relevant Medications   Semaglutide, 1 MG/DOSE, 4 MG/3ML SOPN   Other Relevant Orders   Comprehensive metabolic panel with GFR   AMB Referral VBCI Care Management     Endocrine   Type 2 diabetes mellitus (HCC) - Primary   Relevant Medications   Semaglutide, 1 MG/DOSE, 4 MG/3ML SOPN   Other Relevant Orders   Hemoglobin A1c   Urine microalbumin-creatinine with uACR   AMB Referral VBCI Care Management   Ambulatory referral to Neurology   AMB Referral VBCI Care Management   Hyperlipidemia associated with type 2 diabetes mellitus (HCC)   Relevant Medications   Semaglutide, 1 MG/DOSE, 4 MG/3ML SOPN   Other Relevant Orders   Lipid panel   AMB Referral VBCI Care Management     Musculoskeletal and Integument   Trigger index finger of right hand   Relevant Orders   AMB referral to orthopedics     Other   Tremor of right hand   Relevant Orders   Ambulatory referral to Neurology   AMB Referral VBCI Care Management   Tremor    Relevant Orders   Ambulatory referral to Neurology   AMB Referral VBCI Care Management   Iron deficiency anemia, unspecified   Relevant Orders  AMB Referral VBCI Care Management   Anxiety   Other Visit Diagnoses       Other fatigue       Relevant Orders   B12   CBC   AMB Referral VBCI Care Management     Vitamin B12 deficiency       Relevant Orders   AMB Referral VBCI Care Management       Assessment & Plan Type 2 Diabetes Mellitus Chronic Type 2 diabetes mellitus with blood glucose levels ranging from 150 to 230 mg/dL. She is on Humalog, Lantus, Metformin, and Ozempic. Reports dry eyes and thirst, possibly related to diabetes. Dietary habits include unrestricted intake of carbohydrates and sugars. No loss of appetite with Ozempic. Plan to optimize diabetes management by adjusting medication regimen. Discussed increasing Ozempic to 1 mg weekly as it is when patients start to see changes. - Continue Humalog 15 units three times daily - Continue Lantus 80 units daily - Continue Metformin 500 mg twice daily - Increase Ozempic to 0.5 mg once weekly, then to 1 mg once weekly after one month - Order A1c, basic metabolic panel, urine albumin, and lipid panel  Hyperlipidemia Hyperlipidemia associated with diabetes. Currently on Lipitor 10 mg daily. Plan to continue this medication and monitor lipid levels. - Continue Lipitor 10 mg daily - Order lipid panel  Hypertension Chronic hypertension well-controlled with a blood pressure of 112/70 mmHg. Currently on hydrochlorothiazide 25 mg daily. - Continue hydrochlorothiazide 25 mg daily  Asthma Asthma managed with Dulera 200-5 mcg, two puffs twice daily. - Continue Dulera 200-5 mcg, two puffs twice daily  Trigger Finger Pain and difficulty bending right index finger, consistent with trigger finger. Conservative management includes NSAIDs and exercises, but referral to orthopedics is planned for further evaluation and potential  steroid injection. Discussed that steroid injection is an option but deferred to orthopedics for this procedure. - Recommend meloxicam and exercise stretches - Refer to orthopedics for further evaluation and potential steroid injection  Chronic Pain Chronic pain in various parts of her body, including shoulder and hands. Takes ibuprofen and Tylenol for pain relief. Concern about high doses of ibuprofen affecting stomach, and a switch to meloxicam was considered but not effective. Torn rotator cuff and carpal tunnel syndrome. Discussed risks of high-dose NSAIDs and potential for stomach issues. - Refer to orthopedics for shoulder evaluation - Consider neurology referral for tremors and chronic pain management  Tremors Chronic Tremors in arms and sometimes head. Currently on duloxetine, which helps with depression and bodily pain. Tremors may be related to medication, but further evaluation by neurology is needed. - Refer to neurology for evaluation of tremors  Vitamin B12 Deficiency Reports fatigue and has low vitamin B12 levels, possibly related to metformin use. Vitamin B12 level will be checked to assess current status. - Order vitamin B12 level - Order CBC  Follow-up Requires follow-up for diabetes management, chronic pain, and tremors. Needs assistance with disability navigation. - Provide referral to social worker for disability navigation, VBCI referral submitted  - Assist with setting up MyChart for communication and documentation - Provide a letter for food stamps indicating inability to work - Follow up with orthopedics and neurology as needed     Return in about 6 weeks (around 06/04/2023) for CHRONIC F/U.         Ronnald Ramp, MD  [facility] [phone] (phone) [phone] (fax)  [facility]

## 2023-04-24 ENCOUNTER — Telehealth: Payer: Self-pay

## 2023-04-24 LAB — COMPREHENSIVE METABOLIC PANEL WITH GFR
ALT: 19 IU/L (ref 0–32)
AST: 17 IU/L (ref 0–40)
Albumin: 4.7 g/dL (ref 3.8–4.9)
Alkaline Phosphatase: 117 IU/L (ref 44–121)
BUN/Creatinine Ratio: 15 (ref 9–23)
BUN: 17 mg/dL (ref 6–24)
Bilirubin Total: 0.4 mg/dL (ref 0.0–1.2)
CO2: 28 mmol/L (ref 20–29)
Calcium: 9.7 mg/dL (ref 8.7–10.2)
Chloride: 99 mmol/L (ref 96–106)
Creatinine, Ser: 1.1 mg/dL — ABNORMAL HIGH (ref 0.57–1.00)
Globulin, Total: 2.6 g/dL (ref 1.5–4.5)
Glucose: 88 mg/dL (ref 70–99)
Potassium: 3.7 mmol/L (ref 3.5–5.2)
Sodium: 142 mmol/L (ref 134–144)
Total Protein: 7.3 g/dL (ref 6.0–8.5)
eGFR: 60 mL/min/{1.73_m2} (ref >59)

## 2023-04-24 LAB — MICROALBUMIN / CREATININE URINE RATIO
Creatinine, Urine: 289.5 mg/dL
Microalb/Creat Ratio: 5 mg/g{creat} (ref 0–29)
Microalbumin, Urine: 15.6 ug/mL

## 2023-04-24 LAB — LIPID PANEL
Chol/HDL Ratio: 4.2 ratio (ref 0.0–4.4)
Cholesterol, Total: 147 mg/dL (ref 100–199)
HDL: 35 mg/dL — ABNORMAL LOW (ref >39)
LDL Chol Calc (NIH): 86 mg/dL (ref 0–99)
Triglycerides: 149 mg/dL (ref 0–149)
VLDL Cholesterol Cal: 26 mg/dL (ref 5–40)

## 2023-04-24 LAB — HEMOGLOBIN A1C
Est. average glucose Bld gHb Est-mCnc: 163 mg/dL
Hgb A1c MFr Bld: 7.3 % — ABNORMAL HIGH (ref 4.8–5.6)

## 2023-04-24 LAB — CBC
Hematocrit: 35.7 % (ref 34.0–46.6)
Hemoglobin: 11.9 g/dL (ref 11.1–15.9)
MCH: 29.7 pg (ref 26.6–33.0)
MCHC: 33.3 g/dL (ref 31.5–35.7)
MCV: 89 fL (ref 79–97)
Platelets: 322 10*3/uL (ref 150–450)
RBC: 4.01 x10E6/uL (ref 3.77–5.28)
RDW: 14.5 % (ref 11.7–15.4)
WBC: 7.4 10*3/uL (ref 3.4–10.8)

## 2023-04-24 LAB — VITAMIN B12: Vitamin B-12: 446 pg/mL (ref 232–1245)

## 2023-04-24 NOTE — Telephone Encounter (Signed)
 Copied from CRM [id number]. Topic: General - Other >> Apr 24, 2023  2:06 PM Ethelle Herb L wrote: Reason for CRM: Patient requesting that letter for food stamps indicating inability to work be sent to her via Clinical cytogeneticist.   Patient states she spoke to Dr. Verdia Glad about letter yesterday. Patient needing letter as soon as possible.   Please assist patient further

## 2023-04-25 ENCOUNTER — Telehealth: Payer: Self-pay | Admitting: *Deleted

## 2023-04-25 ENCOUNTER — Encounter: Payer: Self-pay | Admitting: Family Medicine

## 2023-04-25 NOTE — Telephone Encounter (Signed)
 My chart letter of support has been sent

## 2023-04-25 NOTE — Progress Notes (Signed)
 Complex Care Management Note Care Guide Note  04/25/2023 Name: Jamie Massey MRN: [id number] DOB: 06-08-1970   Complex Care Management Outreach Attempts: An unsuccessful telephone outreach was attempted today to offer the patient information about available complex care management services.  Follow Up Plan:  Additional outreach attempts will be made to offer the patient complex care management information and services.   Encounter Outcome:  Patient Request to Call Back  Kandis Ormond, CMA [place]  [facility], [facility] Guide Direct Dial : [phone]  Fax: [phone] Website: [email or web address]

## 2023-04-25 NOTE — Telephone Encounter (Signed)
 Patient calling to inform she will be in the office Monday to pick up letter.

## 2023-04-29 ENCOUNTER — Other Ambulatory Visit: Payer: Self-pay

## 2023-04-29 ENCOUNTER — Telehealth: Payer: Self-pay

## 2023-04-29 NOTE — Telephone Encounter (Signed)
 Please see the message below.

## 2023-04-29 NOTE — Telephone Encounter (Signed)
 Copied from CRM [id number]. Topic: Clinical - Prescription Issue >> Apr 29, 2023 12:26 PM Jamie Massey wrote: Reason for CRM: Jamie Massey called in about medication atorvastatin  (LIPITOR) 10 MG tablet, Jamie Massey was told they were going to up the medication dose but has not received a refill. Jamie Massey would like a follow up.    DULoxetine  (CYMBALTA ) 30 MG capsule Depression medication does she change to the 30 MG or continue taking the 60 MG   Callback [phone]  Jamie Massey did also want to include she knows she has paper work to pick up and will attempt to pass by when she is available to pick up documents

## 2023-04-30 ENCOUNTER — Other Ambulatory Visit: Payer: Self-pay

## 2023-04-30 ENCOUNTER — Other Ambulatory Visit: Payer: Self-pay | Admitting: Family Medicine

## 2023-04-30 MED ORDER — DULOXETINE HCL 60 MG PO CPEP
60.0000 mg | ORAL_CAPSULE | Freq: Every day | ORAL | 1 refills | Status: DC
  Filled 2023-04-30 – 2023-05-07 (×2): qty 90, 90d supply, fill #0
  Filled 2023-08-11: qty 90, 90d supply, fill #1

## 2023-04-30 MED ORDER — ATORVASTATIN CALCIUM 20 MG PO TABS
20.0000 mg | ORAL_TABLET | Freq: Every day | ORAL | 3 refills | Status: AC
  Filled 2023-04-30: qty 90, 90d supply, fill #0
  Filled 2023-08-11: qty 90, 90d supply, fill #1
  Filled 2023-11-08: qty 90, 90d supply, fill #2
  Filled 2024-02-08: qty 90, 90d supply, fill #3

## 2023-04-30 NOTE — Telephone Encounter (Signed)
 Called and was able to inform the pt of the updated medication has been sent to the pharmacy on file. She verbally stated she understood

## 2023-04-30 NOTE — Addendum Note (Signed)
 Addended by: SIMMONS-ROBINSON, Crystalina Stodghill L on: 04/30/2023 08:12 AM   Modules accepted: Orders

## 2023-04-30 NOTE — Telephone Encounter (Signed)
 Updated Lipitor to 20mg  daily for cholesterol  Updated duloxetine  to 60 mg daily   Letter for SNAP benefits was sent via MyChart. If patient needs printed copy, please print Mychart letter for patient

## 2023-05-01 ENCOUNTER — Other Ambulatory Visit: Payer: Self-pay

## 2023-05-01 NOTE — Telephone Encounter (Signed)
 Pt called back about the medications. Pt aware that the rxs were sent to her pharmacy.

## 2023-05-01 NOTE — Progress Notes (Signed)
 Complex Care Management Note Care Guide Note  05/01/2023 Name: Jamie Massey MRN: [id number] DOB: 1970-07-17   Complex Care Management Outreach Attempts: An unsuccessful outreach was attempted for an appointment today.  Follow Up Plan:  No further outreach attempts will be made at this time. We have been unable to contact the patient to offer or enroll patient in complex care management services.  Encounter Outcome:  No Answer  [facility]  [facility], [facility] Health Care Management Assistant Direct Dial : [phone]  Fax: [phone]

## 2023-05-07 ENCOUNTER — Other Ambulatory Visit: Payer: Self-pay

## 2023-05-09 ENCOUNTER — Other Ambulatory Visit: Payer: Self-pay

## 2023-05-12 ENCOUNTER — Other Ambulatory Visit: Payer: Self-pay

## 2023-05-26 DIAGNOSIS — H6123 Impacted cerumen, bilateral: Secondary | ICD-10-CM | POA: Diagnosis not present

## 2023-05-26 DIAGNOSIS — J3489 Other specified disorders of nose and nasal sinuses: Secondary | ICD-10-CM | POA: Diagnosis not present

## 2023-05-26 DIAGNOSIS — J302 Other seasonal allergic rhinitis: Secondary | ICD-10-CM | POA: Diagnosis not present

## 2023-05-26 DIAGNOSIS — H6983 Other specified disorders of Eustachian tube, bilateral: Secondary | ICD-10-CM | POA: Diagnosis not present

## 2023-05-26 DIAGNOSIS — H903 Sensorineural hearing loss, bilateral: Secondary | ICD-10-CM | POA: Diagnosis not present

## 2023-05-27 ENCOUNTER — Other Ambulatory Visit: Payer: Self-pay

## 2023-05-27 MED ORDER — FLUTICASONE PROPIONATE 50 MCG/ACT NA SUSP
2.0000 | Freq: Every day | NASAL | 6 refills | Status: AC
  Filled 2023-05-27 – 2023-09-20 (×4): qty 16, 30d supply, fill #0

## 2023-05-29 ENCOUNTER — Other Ambulatory Visit: Payer: Self-pay

## 2023-05-30 ENCOUNTER — Telehealth: Payer: Self-pay

## 2023-05-30 NOTE — Progress Notes (Signed)
 Complex Care Management Care Guide Note  05/30/2023 Name: DEMARI KROPP MRN: [id number] DOB: [birth year]  Valeen Gartner Grow is a 53 y.o. year old female who is a primary care patient of Simmons-Robinson, Judyann Number, MD and is actively engaged with the care management team. I reached out to Glee Lana by phone today to assist with prior referral  with the Licensed Clinical Child psychotherapist.  Follow up plan: Advised patient to follow up with PCP as 04/23/23 referral was closed due to unable to contact.  [facility]  [facility], [facility] Health Care Management Assistant Direct Dial : [phone]  Fax: [phone]

## 2023-06-01 ENCOUNTER — Other Ambulatory Visit: Payer: Self-pay

## 2023-06-03 ENCOUNTER — Other Ambulatory Visit: Payer: Self-pay

## 2023-06-04 DIAGNOSIS — M65321 Trigger finger, right index finger: Secondary | ICD-10-CM | POA: Diagnosis not present

## 2023-06-05 ENCOUNTER — Telehealth: Payer: Self-pay | Admitting: Family Medicine

## 2023-06-05 ENCOUNTER — Other Ambulatory Visit: Payer: Self-pay

## 2023-06-05 ENCOUNTER — Other Ambulatory Visit: Payer: Self-pay | Admitting: Family Medicine

## 2023-06-05 NOTE — Telephone Encounter (Signed)
 Patient called and wants to speak to Clinical staff & didn't clarify with E2C2 the reason. Follow up with patient. Thank you

## 2023-06-05 NOTE — Telephone Encounter (Signed)
 LOV 04/23/23 NOV 07/10/23 LRF 09/11/22 100 x 0 previous provider

## 2023-06-06 ENCOUNTER — Other Ambulatory Visit: Payer: Self-pay | Admitting: Family Medicine

## 2023-06-06 ENCOUNTER — Other Ambulatory Visit: Payer: Self-pay

## 2023-06-06 MED ORDER — IBUPROFEN 800 MG PO TABS
800.0000 mg | ORAL_TABLET | Freq: Three times a day (TID) | ORAL | 0 refills | Status: DC | PRN
  Filled 2023-06-06: qty 100, 34d supply, fill #0

## 2023-06-06 NOTE — Telephone Encounter (Signed)
 Dr Shann Darnel, Patient has been trying to get this filled.  No one form this practice has ever filled it for her.  Can we give her some until we can get her in for visit to discuss with her PCP

## 2023-06-06 NOTE — Telephone Encounter (Signed)
 Copied from CRM [id number]. Topic: Clinical - Medication Refill >> Jun 06, 2023 10:00 AM Lizabeth Riggs wrote: Medication: ibuprofen  (ADVIL ) 800 MG tablet   Has the patient contacted their pharmacy? Yes (Agent: If no, request that the patient contact the pharmacy for the refill. If patient does not wish to contact the pharmacy document the reason why and proceed with request.) (Agent: If yes, when and what did the pharmacy advise?) Pharmacy need order to refill  This is the patient's preferred pharmacy:  [facility] [address] [place] [address] Phone: [phone] Fax: [phone]  Is this the correct pharmacy for this prescription? Yes If no, delete pharmacy and type the correct one.   Has the prescription been filled recently? No  Is the patient out of the medication? Yes - She has been out a few days  Has the patient been seen for an appointment in the last year OR does the patient have an upcoming appointment? Yes  Can we respond through MyChart? No  Agent: Please be advised that Rx refills may take up to 3 business days. We ask that you follow-up with your pharmacy.

## 2023-06-07 NOTE — Telephone Encounter (Signed)
 Refilled on 06/06/23 in a separate encounter.

## 2023-06-09 ENCOUNTER — Other Ambulatory Visit: Payer: Self-pay

## 2023-06-10 ENCOUNTER — Other Ambulatory Visit: Payer: Self-pay

## 2023-06-10 ENCOUNTER — Ambulatory Visit: Admitting: Family Medicine

## 2023-06-11 ENCOUNTER — Other Ambulatory Visit: Payer: Self-pay

## 2023-06-26 ENCOUNTER — Other Ambulatory Visit: Payer: Self-pay | Admitting: Family Medicine

## 2023-06-26 ENCOUNTER — Other Ambulatory Visit: Payer: Self-pay

## 2023-06-27 ENCOUNTER — Other Ambulatory Visit: Payer: Self-pay

## 2023-06-30 ENCOUNTER — Other Ambulatory Visit: Payer: Self-pay

## 2023-06-30 MED FILL — Triamcinolone Acetonide Cream 0.1%: CUTANEOUS | 15 days supply | Qty: 30 | Fill #0 | Status: CN

## 2023-07-03 ENCOUNTER — Other Ambulatory Visit: Payer: Self-pay

## 2023-07-03 ENCOUNTER — Other Ambulatory Visit: Payer: Self-pay | Admitting: Family Medicine

## 2023-07-04 ENCOUNTER — Other Ambulatory Visit: Payer: Self-pay

## 2023-07-04 MED ORDER — TRIAMCINOLONE ACETONIDE 0.1 % EX OINT
1.0000 | TOPICAL_OINTMENT | Freq: Two times a day (BID) | CUTANEOUS | 2 refills | Status: AC
  Filled 2023-07-04: qty 30, 15d supply, fill #0
  Filled 2023-08-11: qty 30, 15d supply, fill #1
  Filled 2023-09-19: qty 30, 15d supply, fill #2
  Filled 2023-09-19 – 2023-12-18 (×4): qty 30, 15d supply, fill #0

## 2023-07-04 NOTE — Telephone Encounter (Signed)
 Rx for Kenalog  cream was sent to pharmacy.  Patient has called asking for it to be changed to ointment. I have pulled it up if that is what you want to do

## 2023-07-07 ENCOUNTER — Other Ambulatory Visit: Payer: Self-pay

## 2023-07-08 ENCOUNTER — Other Ambulatory Visit: Payer: Self-pay

## 2023-07-10 ENCOUNTER — Ambulatory Visit: Admitting: Family Medicine

## 2023-07-16 ENCOUNTER — Emergency Department

## 2023-07-16 ENCOUNTER — Other Ambulatory Visit: Payer: Self-pay

## 2023-07-16 ENCOUNTER — Emergency Department
Admission: EM | Admit: 2023-07-16 | Discharge: 2023-07-16 | Disposition: A | Discharge status: Home / Self Care | Attending: Emergency Medicine | Admitting: Emergency Medicine

## 2023-07-16 DIAGNOSIS — E119 Type 2 diabetes mellitus without complications: Secondary | ICD-10-CM | POA: Insufficient documentation

## 2023-07-16 DIAGNOSIS — F419 Anxiety disorder, unspecified: Secondary | ICD-10-CM | POA: Insufficient documentation

## 2023-07-16 DIAGNOSIS — R350 Frequency of micturition: Secondary | ICD-10-CM | POA: Diagnosis not present

## 2023-07-16 DIAGNOSIS — J45909 Unspecified asthma, uncomplicated: Secondary | ICD-10-CM | POA: Diagnosis not present

## 2023-07-16 DIAGNOSIS — I10 Essential (primary) hypertension: Secondary | ICD-10-CM | POA: Insufficient documentation

## 2023-07-16 DIAGNOSIS — R0602 Shortness of breath: Secondary | ICD-10-CM | POA: Diagnosis not present

## 2023-07-16 DIAGNOSIS — R232 Flushing: Secondary | ICD-10-CM | POA: Diagnosis not present

## 2023-07-16 LAB — BASIC METABOLIC PANEL WITH GFR
Anion gap: 11 (ref 5–15)
BUN: 19 mg/dL (ref 6–20)
CO2: 27 mmol/L (ref 22–32)
Calcium: 9.6 mg/dL (ref 8.9–10.3)
Chloride: 101 mmol/L (ref 98–111)
Creatinine, Ser: 1.01 mg/dL — ABNORMAL HIGH (ref 0.44–1.00)
GFR, Estimated: >60 mL/min (ref >60)
Glucose, Bld: 207 mg/dL — ABNORMAL HIGH (ref 70–99)
Potassium: 3.6 mmol/L (ref 3.5–5.1)
Sodium: 139 mmol/L (ref 135–145)

## 2023-07-16 LAB — CBC
HCT: 33.7 % — ABNORMAL LOW (ref 36.0–46.0)
Hemoglobin: 11.1 g/dL — ABNORMAL LOW (ref 12.0–15.0)
MCH: 30.7 pg (ref 26.0–34.0)
MCHC: 32.9 g/dL (ref 30.0–36.0)
MCV: 93.1 fL (ref 80.0–100.0)
Platelets: 226 K/uL (ref 150–400)
RBC: 3.62 MIL/uL — ABNORMAL LOW (ref 3.87–5.11)
RDW: 15.3 % (ref 11.5–15.5)
WBC: 6.5 K/uL (ref 4.0–10.5)
nRBC: 0 % (ref 0.0–0.2)

## 2023-07-16 LAB — URINALYSIS, ROUTINE W REFLEX MICROSCOPIC
Bilirubin Urine: NEGATIVE
Glucose, UA: NEGATIVE mg/dL
Hgb urine dipstick: NEGATIVE
Ketones, ur: NEGATIVE mg/dL
Leukocytes,Ua: NEGATIVE
Nitrite: NEGATIVE
Protein, ur: NEGATIVE mg/dL
Specific Gravity, Urine: 1.012 (ref 1.005–1.030)
pH: 5 (ref 5.0–8.0)

## 2023-07-16 LAB — TROPONIN I (HIGH SENSITIVITY)
Troponin I (High Sensitivity): 4 ng/L (ref <18)
Troponin I (High Sensitivity): 3 ng/L (ref <18)

## 2023-07-16 NOTE — ED Notes (Signed)
Pt verbalizes understanding of discharge instructions. Opportunity for questioning and answers were provided. Pt discharged from ED to home with daughter.    

## 2023-07-16 NOTE — ED Provider Notes (Signed)
 [facility] Provider Note    Event Date/Time   First MD Initiated Contact with Patient 07/16/23 1118     (approximate)   History   Shortness of Breath   HPI  Jamie Massey is a 53 y.o. female who presents to the ED for evaluation of Shortness of Breath   I review a PCP visit from April.  Morbidly obese patient with history of HTN, HLD, DM, anxiety and panic attacks, asthma, chronic pain syndrome and tremors.  Patient presents to the ED alongside her daughter for evaluation of a spell she had this morning of feeling flushed throughout her chest and abdomen.  This lasted about 15 minutes before self resolving and has not recurred.  Reports it made her feel quite anxious and she questions a panic attack.  Also reporting 5 days of urinary frequency and a sensation of incomplete emptying.  No fevers, emesis, stool changes  She reports feeling well now and has no complaints   Physical Exam   Triage Vital Signs: ED Triage Vitals  Encounter Vitals Group     BP 07/16/23 0644 (!) 143/79     Girls Systolic BP Percentile --      Girls Diastolic BP Percentile --      Boys Systolic BP Percentile --      Boys Diastolic BP Percentile --      Pulse Rate 07/16/23 0644 89     Resp 07/16/23 0644 19     Temp 07/16/23 0644 98.2 F (36.8 C)     Temp Source 07/16/23 0644 Oral     SpO2 07/16/23 0644 100 %     Weight 07/16/23 0642 215 lb (97.5 kg)     Height 07/16/23 0642 5' (1.524 m)     Head Circumference --      Peak Flow --      Pain Score 07/16/23 0638 0     Pain Loc --      Pain Education --      Exclude from Growth Chart --     Most recent vital signs: Vitals:   07/16/23 0644 07/16/23 1230  BP: (!) 143/79 128/82  Pulse: 89 82  Resp: 19 15  Temp: 98.2 F (36.8 C)   SpO2: 100% 99%    General: Awake, no distress.  CV:  Good peripheral perfusion.  Resp:  Normal effort.  Abd:  No distention.  Soft and benign throughout without any tenderness,  guarding or peritoneal features MSK:  No deformity noted.  Neuro:  No focal deficits appreciated. Other:     ED Results / Procedures / Treatments   Labs (all labs ordered are listed, but only abnormal results are displayed) Labs Reviewed  BASIC METABOLIC PANEL WITH GFR - Abnormal; Notable for the following components:      Result Value   Glucose, Bld 207 (*)    Creatinine, Ser 1.01 (*)    All other components within normal limits  CBC - Abnormal; Notable for the following components:   RBC 3.62 (*)    Hemoglobin 11.1 (*)    HCT 33.7 (*)    All other components within normal limits  URINALYSIS, ROUTINE W REFLEX MICROSCOPIC - Abnormal; Notable for the following components:   Color, Urine YELLOW (*)    APPearance CLEAR (*)    All other components within normal limits  TROPONIN I (HIGH SENSITIVITY)  TROPONIN I (HIGH SENSITIVITY)    EKG Sinus rhythm with a rate of 89 bpm.  Normal axis and intervals look for signs of acute ischemia.  RADIOLOGY CXR interpreted by me without evidence of acute cardiopulmonary pathology.  Official radiology report(s): DG Chest Port 1 View Result Date: 07/16/2023 CLINICAL DATA:  [id number].  Shortness of breath. EXAM: PORTABLE CHEST 1 VIEW COMPARISON:  PA Lat chest 04/15/2022 FINDINGS: The heart size and mediastinal contours are within normal limits. Both lungs are clear. The visualized skeletal structures are unremarkable. IMPRESSION: No active disease.  Stable chest. Electronically Signed   By: Francis Quam M.D.   On: 07/16/2023 07:17    PROCEDURES and INTERVENTIONS:  Procedures  Medications - No data to display   IMPRESSION / MDM / ASSESSMENT AND PLAN / ED COURSE  I reviewed the triage vital signs and the nursing notes.  Differential diagnosis includes, but is not limited to, cardiac dysrhythmia, ACS, UTI or sepsis, anxiety or panic attack  {Patient presents with symptoms of an acute illness or injury that is potentially  life-threatening.  Patient presents after resolved episode of feeling flushed for about 15 minutes that has since resolved.  Has a generally benign workup without evidence of cardiac dysrhythmia.  Reassuring CBC and metabolic panel.  Mild hyperglycemia without acidosis.  Normal troponin.  Considering she came in shortly after this episode we will obtain a second troponin and a UA in the setting of her 5 days of urinary frequency.  Clinical Course as of 07/16/23 1309  Wed Jul 16, 2023  1131 Had a bad night, 5 or 6am spell of 15 mins, flushed in  chest. Urinary frequency, incomplete emptying 5 days.  [DS]  1308 Reassessed.  Still feeling well.  We discussed reassuring workup, urinalysis, second troponin and possible etiologies of her symptoms.  She is comfortable going home, which I think is reasonable.  We discussed return precautions. [DS]    Clinical Course User Index [DS] Claudene Rover, MD     FINAL CLINICAL IMPRESSION(S) / ED DIAGNOSES   Final diagnoses:  Flushing reaction  Shortness of breath  Anxiety  Urinary frequency     Rx / DC Orders   ED Discharge Orders     None        Note:  This document was prepared using Dragon voice recognition software and may include unintentional dictation errors.   Claudene Rover, MD 07/16/23 [phone]

## 2023-07-16 NOTE — ED Triage Notes (Addendum)
 Pt arrived via POV with reports of not feeling well this morning, pt states she felt like an anxiety attack, reports shortness of breath, pt states she felt very hot denies any pain in her chest at this time, pt states EMS came to her house to check her out, reports elevated BP.    Pt also reports she has spells where she gets confused, shakes with R arm and R leg, pt's PCP says it may be related to a medication she takes.   Pt reports multiple medical complaints including UTI.

## 2023-07-30 ENCOUNTER — Other Ambulatory Visit: Payer: Self-pay | Admitting: Family Medicine

## 2023-07-30 DIAGNOSIS — E1165 Type 2 diabetes mellitus with hyperglycemia: Secondary | ICD-10-CM

## 2023-07-31 ENCOUNTER — Other Ambulatory Visit: Payer: Self-pay

## 2023-07-31 MED ORDER — OZEMPIC (1 MG/DOSE) 4 MG/3ML ~~LOC~~ SOPN
1.0000 mg | PEN_INJECTOR | SUBCUTANEOUS | 2 refills | Status: DC
  Filled 2023-07-31: qty 3, 28d supply, fill #0
  Filled 2023-08-29: qty 3, 28d supply, fill #1
  Filled 2023-09-19 – 2023-10-20 (×2): qty 3, 28d supply, fill #2

## 2023-07-31 MED ORDER — IBUPROFEN 800 MG PO TABS
800.0000 mg | ORAL_TABLET | Freq: Three times a day (TID) | ORAL | 0 refills | Status: DC | PRN
  Filled 2023-07-31: qty 100, 34d supply, fill #0

## 2023-08-01 ENCOUNTER — Other Ambulatory Visit: Payer: Self-pay

## 2023-08-11 ENCOUNTER — Other Ambulatory Visit: Payer: Self-pay | Admitting: Family Medicine

## 2023-08-11 ENCOUNTER — Other Ambulatory Visit: Payer: Self-pay

## 2023-08-15 ENCOUNTER — Other Ambulatory Visit: Payer: Self-pay

## 2023-08-29 ENCOUNTER — Other Ambulatory Visit: Payer: Self-pay

## 2023-09-01 ENCOUNTER — Other Ambulatory Visit: Payer: Self-pay

## 2023-09-01 ENCOUNTER — Encounter

## 2023-09-01 ENCOUNTER — Ambulatory Visit: Admitting: Family Medicine

## 2023-09-01 MED ORDER — REFRESH OPTIVE 0.5-0.9 % OP SOLN
1.0000 [drp] | Freq: Four times a day (QID) | OPHTHALMIC | 6 refills | Status: AC
  Filled 2023-09-01 (×3): qty 15, 75d supply, fill #0

## 2023-09-01 NOTE — Progress Notes (Deleted)
 Established patient visit   Patient: Jamie Massey   DOB: 1970/10/14   53 y.o. Female  MRN: [id number] Visit Date: 09/01/2023  Today's healthcare provider: Rockie Agent, MD   No chief complaint on file.  Subjective       Discussed the use of AI scribe software for clinical note transcription with the patient, who gave verbal consent to proceed.  History of Present Illness      Past Medical History:  Diagnosis Date   Allergy    Anxiety    Arthritis    Asthma    Blood transfusion without reported diagnosis    Depression    Diabetes mellitus without complication (HCC)    DVT (deep venous thrombosis) (HCC)    GERD (gastroesophageal reflux disease)    Hypertension     Medications: Outpatient Medications Prior to Visit  Medication Sig   Accu-Chek Softclix Lancets lancets Use 3 (three) times daily for blood sugar testing   albuterol  (PROVENTIL ) (2.5 MG/3ML) 0.083% nebulizer solution Inhale 3 mLs into the lungs every 4 (four) hours for wheezing or shortness of breath   albuterol  (VENTOLIN  HFA) 108 (90 Base) MCG/ACT inhaler Inhale into the lungs as directed.   atorvastatin  (LIPITOR) 20 MG tablet Take 1 tablet (20 mg total) by mouth daily.   carboxymethylcellul-glycerin  (OPTIVE) 0.5-0.9 % ophthalmic solution Place 1 drop into both eyes 3 (three) to 4 (four) times daily.   cyanocobalamin  (VITAMIN B12) 1000 MCG/ML injection Inject 1,000 mLs (1,000,000 mcg total) into the muscle once a week for 4 weeks, then once a month for vitamin B12 deficiency   DULoxetine  (CYMBALTA ) 60 MG capsule Take 1 capsule (60 mg total) by mouth daily.   fluticasone  (FLONASE ) 50 MCG/ACT nasal spray Place 2 sprays into both nostrils daily.   glucose blood test strip Use as instructed to monitor glucose three times daily   HUMALOG  KWIKPEN 100 UNIT/ML KwikPen Inject 15 Units into the skin 3 (three) times daily.   hydrochlorothiazide  (HYDRODIURIL ) 25 MG tablet Take 1 tablet (25 mg total) by  mouth daily. for high blood pressure   ibuprofen  (ADVIL ) 800 MG tablet Take 1 tablet (800 mg total) by mouth every 8 (eight) hours as needed for pain. Take with food.   IBUPROFEN  PO Take by mouth.   insulin  glargine (LANTUS ) 100 UNIT/ML injection Inject 80 Units into the skin at bedtime.   insulin  glargine (LANTUS ) 100 UNIT/ML Solostar Pen Inject 80 Units into the skin daily for diabetes   insulin  lispro (HUMALOG ) 100 UNIT/ML KwikPen Inject 15 Units into the skin 3 (three) times daily with meals   Insulin  Pen Needle (TRUEPLUS 5-BEVEL PEN NEEDLES) 32G X 4 MM MISC Use 5 (five) times daily with insulin  injections.   Insulin  Pen Needle (TRUEPLUS PEN NEEDLES) 32G X 4 MM MISC Use in the morning, at noon, in the evening, and at bedtime.   metFORMIN  (GLUCOPHAGE ) 500 MG tablet Take 1 tablet (500 mg total) by mouth 2 (two) times daily with a meal.   mometasone -formoterol  (DULERA) 200-5 MCG/ACT AERO Inhale 2 puffs into the lungs 2 (two) times daily for asthma. Rinse and spit with water after each use.   Semaglutide , 1 MG/DOSE, (OZEMPIC , 1 MG/DOSE,) 4 MG/3ML SOPN Inject 1 mg into the skin once a week.   triamcinolone  (NASACORT ) 55 MCG/ACT AERO nasal inhaler Place 2 sprays into the nose daily.   triamcinolone  ointment (KENALOG ) 0.1 % Apply 1 Application topically 2 (two) times daily.   No facility-administered medications  prior to visit.    Review of Systems  {Insert previous labs (optional):23779} {See past labs  Heme  Chem  Endocrine  Serology  Results Review (optional):1}   Objective    There were no vitals taken for this visit. {Insert last BP/Wt (optional):23777}{See vitals history (optional):1}    Physical Exam  ***  No results found for any visits on 09/01/23.  Assessment & Plan     Problem List Items Addressed This Visit   None   Assessment and Plan Assessment & Plan      No follow-ups on file.         Rockie Agent, MD  [facility] [phone] (phone) [phone] (fax)  [facility]

## 2023-09-02 ENCOUNTER — Other Ambulatory Visit: Payer: Self-pay

## 2023-09-10 ENCOUNTER — Other Ambulatory Visit: Payer: Self-pay | Admitting: Family Medicine

## 2023-09-10 ENCOUNTER — Other Ambulatory Visit: Payer: Self-pay

## 2023-09-11 ENCOUNTER — Other Ambulatory Visit: Payer: Self-pay

## 2023-09-11 ENCOUNTER — Other Ambulatory Visit: Payer: Self-pay | Admitting: Family Medicine

## 2023-09-11 NOTE — Telephone Encounter (Signed)
 Requested medications are due for refill today.  yes  Requested medications are on the active medications list.  yes  Last refill. varied  Future visit scheduled.   yes  Notes to clinic.  Abnormal hospital labs.    Requested Prescriptions  Pending Prescriptions Disp Refills   hydrochlorothiazide  (HYDRODIURIL ) 25 MG tablet 90 tablet 1    Sig: Take 1 tablet (25 mg total) by mouth daily. for high blood pressure     Cardiovascular: Diuretics - Thiazide Failed - 09/11/2023  5:08 PM      Failed - Cr in normal range and within 180 days    Creatinine  Date Value Ref Range Status  01/25/2013 0.71 0.60 - 1.30 mg/dL Final   Creatinine, Ser  Date Value Ref Range Status  07/16/2023 1.01 (H) 0.44 - 1.00 mg/dL Final         Passed - K in normal range and within 180 days    Potassium  Date Value Ref Range Status  07/16/2023 3.6 3.5 - 5.1 mmol/L Final  01/25/2013 3.4 (L) 3.5 - 5.1 mmol/L Final         Passed - Na in normal range and within 180 days    Sodium  Date Value Ref Range Status  07/16/2023 139 135 - 145 mmol/L Final  04/23/2023 142 134 - 144 mmol/L Final  01/25/2013 135 (L) 136 - 145 mmol/L Final         Passed - Last BP in normal range    BP Readings from Last 1 Encounters:  07/16/23 128/82         Passed - Valid encounter within last 6 months    Recent Outpatient Visits           4 months ago Type 2 diabetes mellitus with hyperglycemia, with long-term current use of insulin  (HCC)   [place] [facility] Simmons-Robinson, [place], MD   6 months ago Type 2 diabetes mellitus with hyperglycemia, with long-term current use of insulin  (HCC)   [place] [facility] Simmons-Robinson, [place], MD               ibuprofen  (ADVIL ) 800 MG tablet 100 tablet 0    Sig: Take 1 tablet (800 mg total) by mouth every 8 (eight) hours as needed for pain. Take with food.     Analgesics:  NSAIDS Failed - 09/11/2023  5:08 PM      Failed - Manual  Review: Labs are only required if the patient has taken medication for more than 8 weeks.      Failed - Cr in normal range and within 360 days    Creatinine  Date Value Ref Range Status  01/25/2013 0.71 0.60 - 1.30 mg/dL Final   Creatinine, Ser  Date Value Ref Range Status  07/16/2023 1.01 (H) 0.44 - 1.00 mg/dL Final         Failed - HGB in normal range and within 360 days    Hemoglobin  Date Value Ref Range Status  07/16/2023 11.1 (L) 12.0 - 15.0 g/dL Final  95/83/7974 88.0 11.1 - 15.9 g/dL Final         Failed - HCT in normal range and within 360 days    HCT  Date Value Ref Range Status  07/16/2023 33.7 (L) 36.0 - 46.0 % Final   Hematocrit  Date Value Ref Range Status  04/23/2023 35.7 34.0 - 46.6 % Final         Passed - PLT in normal  range and within 360 days    Platelets  Date Value Ref Range Status  07/16/2023 226 150 - 400 K/uL Final  04/23/2023 322 150 - 450 x10E3/uL Final         Passed - eGFR is 30 or above and within 360 days    EGFR (African American)  Date Value Ref Range Status  01/25/2013 >60  Final   GFR calc Af Amer  Date Value Ref Range Status  04/26/2019 >60 >60 mL/min Final   EGFR (Non-African Amer.)  Date Value Ref Range Status  01/25/2013 >60  Final    Comment:    eGFR values <20mL/min/1.73 m2 may be an indication of chronic kidney disease (CKD). Calculated eGFR is useful in patients with stable renal function. The eGFR calculation will not be reliable in acutely ill patients when serum creatinine is changing rapidly. It is not useful in  patients on dialysis. The eGFR calculation may not be applicable to patients at the low and high extremes of body sizes, pregnant women, and vegetarians.    GFR, Estimated  Date Value Ref Range Status  07/16/2023 >60 >60 mL/min Final    Comment:    (NOTE) Calculated using the CKD-EPI Creatinine Equation (2021)    eGFR  Date Value Ref Range Status  04/23/2023 60 >59 mL/min/1.73 Final          Passed - Patient is not pregnant      Passed - Valid encounter within last 12 months    Recent Outpatient Visits           4 months ago Type 2 diabetes mellitus with hyperglycemia, with long-term current use of insulin  (HCC)   [facility] Simmons-Robinson, [place], MD   6 months ago Type 2 diabetes mellitus with hyperglycemia, with long-term current use of insulin  (HCC)   [place] [facility] Simmons-Robinson, [place], [place]               metFORMIN  (GLUCOPHAGE ) 500 MG tablet 180 tablet 1    Sig: Take 1 tablet (500 mg total) by mouth 2 (two) times daily with a meal.     Endocrinology:  Diabetes - Biguanides Failed - 09/11/2023  5:08 PM      Failed - Cr in normal range and within 360 days    Creatinine  Date Value Ref Range Status  01/25/2013 0.71 0.60 - 1.30 mg/dL Final   Creatinine, Ser  Date Value Ref Range Status  07/16/2023 1.01 (H) 0.44 - 1.00 mg/dL Final         Failed - CBC within normal limits and completed in the last 12 months    WBC  Date Value Ref Range Status  07/16/2023 6.5 4.0 - 10.5 K/uL Final   RBC  Date Value Ref Range Status  07/16/2023 3.62 (L) 3.87 - 5.11 MIL/uL Final   Hemoglobin  Date Value Ref Range Status  07/16/2023 11.1 (L) 12.0 - 15.0 g/dL Final  95/83/7974 88.0 11.1 - 15.9 g/dL Final   HCT  Date Value Ref Range Status  07/16/2023 33.7 (L) 36.0 - 46.0 % Final   Hematocrit  Date Value Ref Range Status  04/23/2023 35.7 34.0 - 46.6 % Final   MCHC  Date Value Ref Range Status  07/16/2023 32.9 30.0 - 36.0 g/dL Final   [facility]  Date Value Ref Range Status  07/16/2023 30.7 26.0 - 34.0 pg Final   MCV  Date Value Ref Range Status  07/16/2023 93.1 80.0 - 100.0  fL Final  04/23/2023 89 79 - 97 fL Final  01/25/2013 93 80 - 100 fL Final   No results found for: PLTCOUNTKUC, LABPLAT, POCPLA RDW  Date Value Ref Range Status  07/16/2023 15.3 11.5 - 15.5 % Final  04/23/2023 14.5 11.7 - 15.4 %  Final  01/25/2013 15.4 (H) 11.5 - 14.5 % Final         Passed - HBA1C is between 0 and 7.9 and within 180 days    Hgb A1c MFr Bld  Date Value Ref Range Status  04/23/2023 7.3 (H) 4.8 - 5.6 % Final    Comment:             Prediabetes: 5.7 - 6.4          Diabetes: >6.4          Glycemic control for adults with diabetes: <7.0          Passed - eGFR in normal range and within 360 days    EGFR (African American)  Date Value Ref Range Status  01/25/2013 >60  Final   GFR calc Af Amer  Date Value Ref Range Status  04/26/2019 >60 >60 mL/min Final   EGFR (Non-African Amer.)  Date Value Ref Range Status  01/25/2013 >60  Final    Comment:    eGFR values <55mL/min/1.73 m2 may be an indication of chronic kidney disease (CKD). Calculated eGFR is useful in patients with stable renal function. The eGFR calculation will not be reliable in acutely ill patients when serum creatinine is changing rapidly. It is not useful in  patients on dialysis. The eGFR calculation may not be applicable to patients at the low and high extremes of body sizes, pregnant women, and vegetarians.    GFR, Estimated  Date Value Ref Range Status  07/16/2023 >60 >60 mL/min Final    Comment:    (NOTE) Calculated using the CKD-EPI Creatinine Equation (2021)    eGFR  Date Value Ref Range Status  04/23/2023 60 >59 mL/min/1.73 Final         Passed - B12 Level in normal range and within 720 days    Vitamin B-12  Date Value Ref Range Status  04/23/2023 446 232 - 1,245 pg/mL Final         Passed - Valid encounter within last 6 months    Recent Outpatient Visits           4 months ago Type 2 diabetes mellitus with hyperglycemia, with long-term current use of insulin  (HCC)   [facility] Simmons-Robinson, [place], MD   6 months ago Type 2 diabetes mellitus with hyperglycemia, with long-term current use of insulin  [facility])    [facility] Simmons-Robinson,  [place], MD

## 2023-09-12 ENCOUNTER — Other Ambulatory Visit: Payer: Self-pay

## 2023-09-12 ENCOUNTER — Other Ambulatory Visit: Payer: Self-pay | Admitting: Family Medicine

## 2023-09-12 ENCOUNTER — Telehealth: Payer: Self-pay | Admitting: Family Medicine

## 2023-09-12 MED ORDER — IBUPROFEN 800 MG PO TABS
800.0000 mg | ORAL_TABLET | Freq: Three times a day (TID) | ORAL | 1 refills | Status: DC | PRN
  Filled 2023-09-12: qty 100, 34d supply, fill #0
  Filled 2023-11-10: qty 100, 34d supply, fill #1

## 2023-09-12 MED ORDER — METFORMIN HCL 500 MG PO TABS
500.0000 mg | ORAL_TABLET | Freq: Two times a day (BID) | ORAL | 1 refills | Status: DC
  Filled 2023-09-12 – 2023-09-15 (×2): qty 180, 90d supply, fill #0

## 2023-09-12 MED ORDER — HYDROCHLOROTHIAZIDE 25 MG PO TABS
25.0000 mg | ORAL_TABLET | Freq: Every day | ORAL | 1 refills | Status: AC
  Filled 2023-09-12 – 2023-09-15 (×2): qty 90, 90d supply, fill #0
  Filled 2023-12-08: qty 90, 90d supply, fill #1

## 2023-09-12 NOTE — Telephone Encounter (Unsigned)
 Copied from CRM [id number]. Topic: Clinical - Medication Refill >> Sep 12, 2023  1:44 PM Charlet HERO wrote: Medication: hydrochlorothiazide  (HYDRODIURIL ) 25 MG tablet ibuprofen  (ADVIL ) 800 MG tablet metFORMIN  (GLUCOPHAGE ) 500 MG tablet  Has the patient contacted their pharmacy? Yes Have not been able to get reply back from office  This is the patient's preferred pharmacy:  [facility] [address] [place] [address] Phone: [phone] Fax: [phone]  Is this the correct pharmacy for this prescription? Yes If no, delete pharmacy and type the correct one.   Has the prescription been filled recently? Yes  Is the patient out of the medication? Yes  Has the patient been seen for an appointment in the last year OR does the patient have an upcoming appointment? Yes  Can we respond through MyChart? No  Agent: Please be advised that Rx refills may take up to 3 business days. We ask that you follow-up with your pharmacy.

## 2023-09-12 NOTE — Telephone Encounter (Signed)
Refills sent for requested medications.

## 2023-09-13 ENCOUNTER — Other Ambulatory Visit: Payer: Self-pay

## 2023-09-14 ENCOUNTER — Other Ambulatory Visit: Payer: Self-pay

## 2023-09-14 ENCOUNTER — Other Ambulatory Visit (HOSPITAL_COMMUNITY): Payer: Self-pay

## 2023-09-15 ENCOUNTER — Other Ambulatory Visit: Payer: Self-pay

## 2023-09-19 ENCOUNTER — Other Ambulatory Visit (HOSPITAL_COMMUNITY): Payer: Self-pay

## 2023-09-19 ENCOUNTER — Encounter: Payer: Self-pay | Admitting: Pharmacist

## 2023-09-19 ENCOUNTER — Other Ambulatory Visit: Payer: Self-pay

## 2023-09-19 ENCOUNTER — Other Ambulatory Visit: Payer: Self-pay | Admitting: Family Medicine

## 2023-09-20 ENCOUNTER — Other Ambulatory Visit: Payer: Self-pay

## 2023-09-22 ENCOUNTER — Other Ambulatory Visit: Payer: Self-pay

## 2023-09-30 ENCOUNTER — Other Ambulatory Visit: Payer: Self-pay

## 2023-10-18 ENCOUNTER — Other Ambulatory Visit: Payer: Self-pay

## 2023-10-20 ENCOUNTER — Other Ambulatory Visit: Payer: Self-pay

## 2023-10-21 ENCOUNTER — Other Ambulatory Visit: Payer: Self-pay

## 2023-10-21 ENCOUNTER — Other Ambulatory Visit: Payer: Self-pay | Admitting: Family Medicine

## 2023-10-22 ENCOUNTER — Other Ambulatory Visit (HOSPITAL_COMMUNITY)
Admission: RE | Admit: 2023-10-22 | Discharge: 2023-10-22 | Disposition: A | Discharge status: Home / Self Care | Source: Ambulatory Visit | Attending: Family Medicine | Admitting: Family Medicine

## 2023-10-22 ENCOUNTER — Other Ambulatory Visit: Payer: Self-pay

## 2023-10-22 ENCOUNTER — Encounter: Payer: Self-pay | Admitting: Family Medicine

## 2023-10-22 ENCOUNTER — Ambulatory Visit: Admitting: Family Medicine

## 2023-10-22 VITALS — BP 124/80 | HR 93 | Temp 98.4°F | Ht 60.0 in | Wt 215.2 lb

## 2023-10-22 DIAGNOSIS — I152 Hypertension secondary to endocrine disorders: Secondary | ICD-10-CM | POA: Diagnosis not present

## 2023-10-22 DIAGNOSIS — L292 Pruritus vulvae: Secondary | ICD-10-CM | POA: Insufficient documentation

## 2023-10-22 DIAGNOSIS — G629 Polyneuropathy, unspecified: Secondary | ICD-10-CM | POA: Insufficient documentation

## 2023-10-22 DIAGNOSIS — Z205 Contact with and (suspected) exposure to viral hepatitis: Secondary | ICD-10-CM

## 2023-10-22 DIAGNOSIS — K5909 Other constipation: Secondary | ICD-10-CM | POA: Diagnosis not present

## 2023-10-22 DIAGNOSIS — E538 Deficiency of other specified B group vitamins: Secondary | ICD-10-CM

## 2023-10-22 DIAGNOSIS — Z1159 Encounter for screening for other viral diseases: Secondary | ICD-10-CM

## 2023-10-22 DIAGNOSIS — Z23 Encounter for immunization: Secondary | ICD-10-CM

## 2023-10-22 DIAGNOSIS — J4531 Mild persistent asthma with (acute) exacerbation: Secondary | ICD-10-CM

## 2023-10-22 DIAGNOSIS — E66813 Obesity, class 3: Secondary | ICD-10-CM | POA: Diagnosis not present

## 2023-10-22 DIAGNOSIS — Z794 Long term (current) use of insulin: Secondary | ICD-10-CM | POA: Diagnosis not present

## 2023-10-22 DIAGNOSIS — E1169 Type 2 diabetes mellitus with other specified complication: Secondary | ICD-10-CM | POA: Diagnosis not present

## 2023-10-22 DIAGNOSIS — E1165 Type 2 diabetes mellitus with hyperglycemia: Secondary | ICD-10-CM | POA: Diagnosis not present

## 2023-10-22 DIAGNOSIS — Z6841 Body Mass Index (BMI) 40.0 and over, adult: Secondary | ICD-10-CM | POA: Diagnosis not present

## 2023-10-22 DIAGNOSIS — E1159 Type 2 diabetes mellitus with other circulatory complications: Secondary | ICD-10-CM | POA: Diagnosis not present

## 2023-10-22 DIAGNOSIS — M19041 Primary osteoarthritis, right hand: Secondary | ICD-10-CM

## 2023-10-22 DIAGNOSIS — D508 Other iron deficiency anemias: Secondary | ICD-10-CM

## 2023-10-22 DIAGNOSIS — Z114 Encounter for screening for human immunodeficiency virus [HIV]: Secondary | ICD-10-CM

## 2023-10-22 LAB — POCT URINALYSIS DIPSTICK
Bilirubin, UA: NEGATIVE
Blood, UA: NEGATIVE
Glucose, UA: NEGATIVE
Ketones, UA: NEGATIVE
Leukocytes, UA: NEGATIVE
Nitrite, UA: NEGATIVE
Protein, UA: POSITIVE — AB
Spec Grav, UA: 1.015 (ref 1.010–1.025)
Urobilinogen, UA: 0.2 U/dL
pH, UA: 6 (ref 5.0–8.0)

## 2023-10-22 MED ORDER — INSULIN LISPRO (1 UNIT DIAL) 100 UNIT/ML (KWIKPEN)
15.0000 [IU] | PEN_INJECTOR | Freq: Three times a day (TID) | SUBCUTANEOUS | 6 refills | Status: DC
  Filled 2023-10-22: qty 15, 34d supply, fill #0
  Filled 2023-11-21: qty 15, 34d supply, fill #1
  Filled 2024-01-05: qty 15, 34d supply, fill #2

## 2023-10-22 MED ORDER — OZEMPIC (2 MG/DOSE) 8 MG/3ML ~~LOC~~ SOPN
2.0000 mg | PEN_INJECTOR | SUBCUTANEOUS | 6 refills | Status: DC
  Filled 2023-10-22: qty 3, 28d supply, fill #0

## 2023-10-22 MED ORDER — ALBUTEROL SULFATE HFA 108 (90 BASE) MCG/ACT IN AERS
2.0000 | INHALATION_SPRAY | RESPIRATORY_TRACT | 6 refills | Status: DC | PRN
  Filled 2023-10-22: qty 18, 30d supply, fill #0
  Filled 2023-12-08: qty 18, 30d supply, fill #1

## 2023-10-22 MED ORDER — METFORMIN HCL 500 MG PO TABS
1000.0000 mg | ORAL_TABLET | Freq: Two times a day (BID) | ORAL | 1 refills | Status: AC
  Filled 2023-10-22 – 2023-11-22 (×3): qty 180, 45d supply, fill #0
  Filled 2024-01-05: qty 180, 45d supply, fill #1

## 2023-10-22 MED ORDER — INSULIN GLARGINE 100 UNIT/ML SOLOSTAR PEN
80.0000 [IU] | PEN_INJECTOR | Freq: Every day | SUBCUTANEOUS | 2 refills | Status: AC
  Filled 2023-10-22 – 2023-11-21 (×2): qty 72, 90d supply, fill #0

## 2023-10-22 NOTE — Patient Instructions (Signed)
 To keep you healthy, please keep in mind the following health maintenance items that you are due for:   Health Maintenance Due  Topic Date Due   FOOT EXAM  Never done   HIV Screening  Never done   Hepatitis C Screening  Never done   DTaP/Tdap/Td (1 - Tdap) Never done   Pneumococcal Vaccine: 50+ Years (1 of 2 - PCV) Never done   Hepatitis B Vaccines 19-59 Average Risk (1 of 3 - 19+ 3-dose series) Never done   Colonoscopy  Never done   Zoster Vaccines- Shingrix (1 of 2) Never done   Mammogram  07/24/2019   Cervical Cancer Screening (HPV/Pap Cotest)  06/23/2023   OPHTHALMOLOGY EXAM  06/24/2023   Influenza Vaccine  08/08/2023   COVID-19 Vaccine (4 - 2025-26 season) 09/08/2023     Best Wishes,   Dr. Lang

## 2023-10-22 NOTE — Progress Notes (Signed)
 Established patient visit   Patient: Jamie Massey   DOB: [birth year]   53 y.o. Female  MRN: [id number] Visit Date: 10/22/2023  Today's healthcare provider: Rockie Agent, MD   Chief Complaint  Patient presents with   Medical Management of Chronic Issues    Patient reports that she has been out of her ozempic  for about 2 weeks. Would like to either refill this or increase dose, states 1 mg was not working very well.  Requests refill of humalog     Vaginal Itching    POCT urinalysis done and patient would like to swab for yeast for itching, no discharge or abd pain.    Constipation    Patient reports on and off constipation for a few years, would like rx to help with chronic constipation   Subjective     HPI     Medical Management of Chronic Issues    Additional comments: Patient reports that she has been out of her ozempic  for about 2 weeks. Would like to either refill this or increase dose, states 1 mg was not working very well.  Requests refill of humalog          Vaginal Itching    Additional comments: POCT urinalysis done and patient would like to swab for yeast for itching, no discharge or abd pain.         Constipation    Additional comments: Patient reports on and off constipation for a few years, would like rx to help with chronic constipation      Last edited by Cherry Chiquita HERO, CMA on 10/22/2023  2:34 PM.       Discussed the use of AI scribe software for clinical note transcription with the patient, who gave verbal consent to proceed.  History of Present Illness Jamie Massey is a 53 year old female with type 2 diabetes, hyperlipidemia, and chronic hypertension who presents for a chronic follow-up.  She has been experiencing issues with diabetes management, with blood sugar levels reaching 300 mg/dL once or twice a day despite dietary efforts and increased water intake. She missed two doses of Ozempic  recently and feels it has not been  effective, causing constipation. She is on 1 mg of Ozempic  weekly, metformin  500 mg twice daily, and Lantus  80 units at bedtime. Her last A1c was 7.3 six months ago.  She experiences chronic constipation, attributing it to Ozempic . She goes four to five days without a bowel movement and has used suppositories and enemas for relief. Metformin  initially helped with bowel movements.  She has a history of asthma, using albuterol  as needed. She has a nebulizer and inhaler, but her inhalers are out of date. Asthma symptoms are more prevalent in the summer.  She reports vulvar itching, which she associates with a recent change in soap. No vaginal discharge.  She has a history of a torn rotator cuff causing significant pain and limiting daily activities, such as babysitting. She takes Motrin  for pain but finds it ineffective. She has not pursued surgery due to insurance issues with MRI coverage.  She reports neuropathy symptoms, including hand pain and numbness, associated with diabetes. She has a history of carpal tunnel syndrome and arthritis in her hands, previously receiving a cortisone shot.  She is on multiple medications, including hydrochlorothiazide  25 mg daily for hypertension, Lipitor 20 mg daily for cholesterol, and duloxetine  60 mg for depression. She takes vitamin B12 injections as needed for fatigue.  She is concerned about potential  exposure to hepatitis B due to a family member's diagnosis and is interested in screening for hepatitis B and C.     Past Medical History:  Diagnosis Date   Allergy    Anxiety    Arthritis    Asthma    Blood transfusion without reported diagnosis    Depression    Diabetes mellitus without complication (HCC)    DVT (deep venous thrombosis) (HCC)    GERD (gastroesophageal reflux disease)    Hypertension     Medications: Outpatient Medications Prior to Visit  Medication Sig   Accu-Chek Softclix Lancets lancets Use 3 (three) times daily for blood  sugar testing   albuterol  (PROVENTIL ) (2.5 MG/3ML) 0.083% nebulizer solution Inhale 3 mLs into the lungs every 4 (four) hours for wheezing or shortness of breath   atorvastatin  (LIPITOR) 20 MG tablet Take 1 tablet (20 mg total) by mouth daily.   carboxymethylcellul-glycerin  (OPTIVE) 0.5-0.9 % ophthalmic solution Apply one drop into both eyes 3 to 4 times a day.   DULoxetine  (CYMBALTA ) 60 MG capsule Take 1 capsule (60 mg total) by mouth daily.   fluticasone  (FLONASE ) 50 MCG/ACT nasal spray Place 2 sprays into both nostrils daily.   glucose blood test strip Use as instructed to monitor glucose three times daily   hydrochlorothiazide  (HYDRODIURIL ) 25 MG tablet Take 1 tablet (25 mg total) by mouth daily for high blood pressure   ibuprofen  (ADVIL ) 800 MG tablet Take 1 tablet (800 mg total) by mouth every 8 (eight) hours as needed for pain. Take with food.   IBUPROFEN  PO Take by mouth.   insulin  lispro (HUMALOG ) 100 UNIT/ML KwikPen Inject 15 Units into the skin 3 (three) times daily.   Insulin  Pen Needle (TRUEPLUS 5-BEVEL PEN NEEDLES) 32G X 4 MM MISC Use 5 (five) times daily with insulin  injections.   mometasone -formoterol  (DULERA) 200-5 MCG/ACT AERO Inhale 2 puffs into the lungs 2 (two) times daily for asthma. Rinse and spit with water after each use.   triamcinolone  (NASACORT ) 55 MCG/ACT AERO nasal inhaler Place 2 sprays into the nose daily.   triamcinolone  ointment (KENALOG ) 0.1 % Apply 1 Application topically 2 (two) times daily.   [DISCONTINUED] albuterol  (VENTOLIN  HFA) 108 (90 Base) MCG/ACT inhaler Inhale into the lungs as directed.   [DISCONTINUED] cyanocobalamin  (VITAMIN B12) 1000 MCG/ML injection Inject 1,000 mLs (1,000,000 mcg total) into the muscle once a week for 4 weeks, then once a month for vitamin B12 deficiency   [DISCONTINUED] insulin  glargine (LANTUS ) 100 UNIT/ML injection Inject 80 Units into the skin at bedtime.   [DISCONTINUED] insulin  glargine (LANTUS ) 100 UNIT/ML Solostar Pen  Inject 80 Units into the skin daily for diabetes   [DISCONTINUED] metFORMIN  (GLUCOPHAGE ) 500 MG tablet Take 1 tablet (500 mg total) by mouth 2 (two) times daily with a meal.   [DISCONTINUED] Semaglutide , 1 MG/DOSE, (OZEMPIC , 1 MG/DOSE,) 4 MG/3ML SOPN Inject 1 mg into the skin once a week.   No facility-administered medications prior to visit.    Review of Systems  Last CBC Lab Results  Component Value Date   WBC 6.5 07/16/2023   HGB 11.1 (L) 07/16/2023   HCT 33.7 (L) 07/16/2023   MCV 93.1 07/16/2023   MCH 30.7 07/16/2023   RDW 15.3 07/16/2023   PLT 226 07/16/2023   Last metabolic panel Lab Results  Component Value Date   GLUCOSE 207 (H) 07/16/2023   NA 139 07/16/2023   K 3.6 07/16/2023   CL 101 07/16/2023   CO2 27 07/16/2023  BUN 19 07/16/2023   CREATININE 1.01 (H) 07/16/2023   GFRNONAA >60 07/16/2023   CALCIUM  9.6 07/16/2023   PROT 7.3 04/23/2023   ALBUMIN 4.7 04/23/2023   LABGLOB 2.6 04/23/2023   BILITOT 0.4 04/23/2023   ALKPHOS 117 04/23/2023   AST 17 04/23/2023   ALT 19 04/23/2023   ANIONGAP 11 07/16/2023   Last lipids Lab Results  Component Value Date   CHOL 147 04/23/2023   HDL 35 (L) 04/23/2023   LDLCALC 86 04/23/2023   TRIG 149 04/23/2023   CHOLHDL 4.2 04/23/2023   The 10-year ASCVD risk score (Arnett DK, et al., 2019) is: 5.3%  Last hemoglobin A1c Lab Results  Component Value Date   HGBA1C 7.3 (H) 04/23/2023     Lab Results  Component Value Date   VITAMINB12 446 04/23/2023       Objective    BP 124/80 (BP Location: Left Arm, Patient Position: Sitting, Cuff Size: Normal)   Pulse 93   Temp 98.4 F (36.9 C) (Oral)   Ht 5' (1.524 m)   Wt 215 lb 3.2 oz (97.6 kg)   SpO2 97%   BMI 42.03 kg/m   BP Readings from Last 3 Encounters:  10/22/23 124/80  07/16/23 128/82  04/23/23 112/70   Wt Readings from Last 3 Encounters:  10/22/23 215 lb 3.2 oz (97.6 kg)  07/16/23 215 lb (97.5 kg)  04/23/23 217 lb (98.4 kg)        Physical Exam   Physical Exam MEASUREMENTS: BMI- 42.0. CHEST: Clear to auscultation bilaterally, no wheezes or crackles. EXTREMITIES: Feet normal, no cyanosis or edema. MUSCULOSKELETAL: Left shoulder full rotation, flexion 100 degrees, limited internal rotation, posterior tenderness. NEUROLOGICAL: Monofilament sensation intact.    No results found for any visits on 10/22/23.  Assessment & Plan     Problem List Items Addressed This Visit     Hyperlipidemia associated with type 2 diabetes mellitus (HCC)   Relevant Medications   Semaglutide , 2 MG/DOSE, (OZEMPIC , 2 MG/DOSE,) 8 MG/3ML SOPN   insulin  glargine (LANTUS ) 100 UNIT/ML Solostar Pen   metFORMIN  (GLUCOPHAGE ) 500 MG tablet   Hypertension associated with diabetes (HCC)   Relevant Medications   Semaglutide , 2 MG/DOSE, (OZEMPIC , 2 MG/DOSE,) 8 MG/3ML SOPN   insulin  glargine (LANTUS ) 100 UNIT/ML Solostar Pen   metFORMIN  (GLUCOPHAGE ) 500 MG tablet   Other Relevant Orders   Comprehensive metabolic panel with GFR   Iron deficiency anemia, unspecified   Mild persistent asthma with (acute) exacerbation   Relevant Medications   albuterol  (VENTOLIN  HFA) 108 (90 Base) MCG/ACT inhaler   Obesity   Relevant Medications   Semaglutide , 2 MG/DOSE, (OZEMPIC , 2 MG/DOSE,) 8 MG/3ML SOPN   insulin  glargine (LANTUS ) 100 UNIT/ML Solostar Pen   metFORMIN  (GLUCOPHAGE ) 500 MG tablet   Type 2 diabetes mellitus (HCC) - Primary   Relevant Medications   Semaglutide , 2 MG/DOSE, (OZEMPIC , 2 MG/DOSE,) 8 MG/3ML SOPN   insulin  glargine (LANTUS ) 100 UNIT/ML Solostar Pen   metFORMIN  (GLUCOPHAGE ) 500 MG tablet   Other Relevant Orders   HgB A1c   CBC   Other Visit Diagnoses       Pruritus of vulva       Relevant Orders   Cervicovaginal ancillary only   POCT Urinalysis Dipstick   Urine Culture     Other constipation         Exposure to hepatitis B       Relevant Orders   Hepatitis B Surface AntiBODY     B12 deficiency  Relevant Orders   CBC   Vitamin B12      Encounter for hepatitis C screening test for low risk patient       Relevant Orders   Hepatitis C antibody     Screening for HIV (human immunodeficiency virus)       Relevant Orders   HIV Antibody (routine testing w rflx)       Assessment & Plan Type 2 diabetes mellitus with poor glycemic control and complications Chronic condition  Poor glycemic control with blood sugars frequently in the 260-300 range. Recent missed doses of Ozempic  and issues with medication adherence. Concerns about medication side effects and interactions with depression medication. - Increase Ozempic  to 2 mg once weekly - Increase metformin  to 1000 mg twice daily, titrate slowly to assess tolerance - Ensure Humalog  prescription is filled - Continue Lantus  80 units at bedtime - Perform diabetes foot exam - Order A1c test  Obesity, class 3 Chronic condition  BMI of 42. Weight management is a concern, especially in the context of diabetes management. - increased semaglutide  to 2mg  weekly   Constipation Chronic constipation, possibly exacerbated by Ozempic . Metformin  may help alleviate symptoms. - Increase metformin  to 1000mg  BID  to aid with bowel movements - Consider enemas or suppositories if severe constipation persists - Use stool softeners like Miralax or Docusate as needed  Pruritus vulvae Vulvar itching likely due to soap sensitivity or possible yeast infection. No vaginal discharge reported. - Change to Dove bar soap for sensitive skin - Order vaginal swab to test for gonorrhea, chlamydia, trichomonas, yeast, and bacterial vaginosis  Iron deficiency anemia Chronic condition  Concerned that this could be contributing to neuropathy symptoms in bilateral hands Recheck CBC    Moderate persistent asthma Asthma managed with albuterol  inhaler and nebulizer. - Refill albuterol  inhaler as needed  Chronic hypertension Blood pressure managed with hydrochlorothiazide . - Continue hydrochlorothiazide   25 mg daily - Advise taking hydrochlorothiazide  in the morning to reduce nocturia  Hyperlipidemia Managed with Lipitor. LDL is 86, indicating good control. - Continue Lipitor 20 mg daily  Chronic insomnia  Left shoulder pain due to rotator cuff tear Chronic pain with limited range of motion. Previous treatments include NSAIDs and physical therapy. Insurance issues with MRI coverage. - Refer to orthopedics for further evaluation and management  Hand pain and numbness due to neuropathy and osteoarthritis Increased hand pain and numbness, possibly due to neuropathy or arthritis. Previous diagnosis of carpal tunnel syndrome and arthritis. - Check vitamin B12 and B6 levels - Consider referral to orthopedics for further evaluation  Major depressive disorder and anxiety disorder Current medication includes duloxetine , which may be contributing to increased hunger.     Return in about 3 months (around 01/22/2024) for HTN, DM, Asthma.      Total time spent on today's visit was 63 minutes, including both face-to-face time interviewing and examining the patient, reviewing medical record including labs/imaging/specialist notes, developing and discussing further evaluation,answering patient's questions, ordering referrals to specialists, coordinating follow up care in addition to documenting in the patient's chart.     Rockie Agent, MD  [facility] [phone] (phone) [phone] (fax)  [facility]

## 2023-10-23 ENCOUNTER — Telehealth: Payer: Self-pay

## 2023-10-23 ENCOUNTER — Other Ambulatory Visit: Payer: Self-pay

## 2023-10-23 LAB — CBC
Hematocrit: 33.6 % — ABNORMAL LOW (ref 34.0–46.6)
Hemoglobin: 11.1 g/dL (ref 11.1–15.9)
MCH: 30.5 pg (ref 26.6–33.0)
MCHC: 33 g/dL (ref 31.5–35.7)
MCV: 92 fL (ref 79–97)
Platelets: 281 x10E3/uL (ref 150–450)
RBC: 3.64 x10E6/uL — ABNORMAL LOW (ref 3.77–5.28)
RDW: 13.8 % (ref 11.7–15.4)
WBC: 7.8 x10E3/uL (ref 3.4–10.8)

## 2023-10-23 LAB — HEMOGLOBIN A1C
Est. average glucose Bld gHb Est-mCnc: 166 mg/dL
Hgb A1c MFr Bld: 7.4 % — ABNORMAL HIGH (ref 4.8–5.6)

## 2023-10-23 LAB — HEPATITIS C ANTIBODY: Hep C Virus Ab: NONREACTIVE

## 2023-10-23 LAB — COMPREHENSIVE METABOLIC PANEL WITH GFR
ALT: 22 IU/L (ref 0–32)
AST: 23 IU/L (ref 0–40)
Albumin: 4.5 g/dL (ref 3.8–4.9)
Alkaline Phosphatase: 126 IU/L (ref 49–135)
BUN/Creatinine Ratio: 19 (ref 9–23)
BUN: 22 mg/dL (ref 6–24)
Bilirubin Total: 0.6 mg/dL (ref 0.0–1.2)
CO2: 23 mmol/L (ref 20–29)
Calcium: 9.6 mg/dL (ref 8.7–10.2)
Chloride: 100 mmol/L (ref 96–106)
Creatinine, Ser: 1.13 mg/dL — ABNORMAL HIGH (ref 0.57–1.00)
Globulin, Total: 2.5 g/dL (ref 1.5–4.5)
Glucose: 169 mg/dL — ABNORMAL HIGH (ref 70–99)
Potassium: 3.5 mmol/L (ref 3.5–5.2)
Sodium: 140 mmol/L (ref 134–144)
Total Protein: 7 g/dL (ref 6.0–8.5)
eGFR: 57 mL/min/1.73 — ABNORMAL LOW (ref >59)

## 2023-10-23 LAB — HIV ANTIBODY (ROUTINE TESTING W REFLEX): HIV Screen 4th Generation wRfx: NONREACTIVE

## 2023-10-23 LAB — VITAMIN B12: Vitamin B-12: 413 pg/mL (ref 232–1245)

## 2023-10-23 LAB — HEPATITIS B SURFACE ANTIBODY,QUALITATIVE: Hep B Surface Ab, Qual: NONREACTIVE

## 2023-10-23 NOTE — Telephone Encounter (Signed)
 Will return call once provider reviews lab results

## 2023-10-23 NOTE — Telephone Encounter (Signed)
 Copied from CRM [id number]. Topic: General - Other >> Oct 23, 2023  2:30 PM Winona SAUNDERS wrote: Pt requesting a call for lab results please call her at [phone] tomorrow 10/17 please do not call her today and do not message her on mychart either. She no longer wants to use mychart.

## 2023-10-24 ENCOUNTER — Other Ambulatory Visit (HOSPITAL_COMMUNITY): Payer: Self-pay

## 2023-10-24 LAB — CERVICOVAGINAL ANCILLARY ONLY
Bacterial Vaginitis (gardnerella): NEGATIVE
Candida Glabrata: NEGATIVE
Candida Vaginitis: NEGATIVE
Chlamydia: NEGATIVE
Comment: NEGATIVE
Comment: NEGATIVE
Comment: NEGATIVE
Comment: NEGATIVE
Comment: NEGATIVE
Comment: NORMAL
Neisseria Gonorrhea: NEGATIVE
Trichomonas: NEGATIVE

## 2023-10-24 LAB — URINE CULTURE

## 2023-10-24 LAB — SPECIMEN STATUS REPORT

## 2023-10-28 ENCOUNTER — Telehealth: Payer: Self-pay

## 2023-10-28 NOTE — Telephone Encounter (Signed)
 Copied from CRM #[id number]. Topic: Clinical - Lab/Test Results >> Oct 27, 2023  1:34 PM Darshell M wrote: Reason for CRM: Patient calling again for lab test results. Reached out to CAL. Advised by CAL that provider is out of office today. Advised patient and sending new CRM.

## 2023-10-29 ENCOUNTER — Ambulatory Visit: Payer: Self-pay | Admitting: Family Medicine

## 2023-10-29 ENCOUNTER — Ambulatory Visit: Payer: Self-pay

## 2023-10-29 NOTE — Telephone Encounter (Signed)
 FYI Only or Action Required?: Action required by provider: lab or test result follow-up needed.  Patient was last seen in primary care on 10/22/2023 by Sharma Coyer, MD.  Called Nurse Triage reporting Nausea.  Symptoms began several days ago.  Interventions attempted: Rest, hydration, or home remedies.  Symptoms are: gradually worsening.  Triage Disposition: Call PCP Within 24 Hours  Patient/caregiver understands and will follow disposition?: No, wishes to speak with PCP  Copied from CRM #[id number]. Topic: Clinical - Red Word Triage >> Oct 29, 2023  3:30 PM Roselie BROCKS wrote: Red Word that prompted transfer to Nurse Triage: Patient requests to speak to NT to clarify lab results. Reason for Disposition  Taking prescription medication that could cause nausea (e.g., narcotics/opiates, antibiotics, OCPs, many others)  Answer Assessment - Initial Assessment Questions Patient calling regarding her labs- she has been calling for 3-4days to get results. Provider has been out of the office so unable to confirm results.  CAL contacted and will alert provider again regarding need for results- PT DELETED HER MYCHART- PLEASE CALL  Ozempic - took dose Sunday or Monday Started at 1mg -  she had nausea and it eventually went away 2mg  now and nausea is back and worse.  Patient asking if she can come off of the medication cold [place] or if she would need to go back down to 1mg  first. She doesn't want to lose weight and get better glucose control if she is going to feel like this all the time.     1. NAUSEA SEVERITY: How bad is the nausea? (e.g., mild, moderate, severe; dehydration, weight loss)  4-5x today- hasn't been able to drink anything- 1 glass of tea, minimal food.  2. ONSET: When did the nausea begin?     Since increasing ozempic  dose 3. VOMITING: Any vomiting? If Yes, ask: How many times today?     Denies  4. RECURRENT SYMPTOM: Have you had nausea before? If Yes, ask:  When was the last time? What happened that time?     With increasing Ozempic  dose 5. CAUSE: What do you think is causing the nausea?     Ozempic   Protocols used: Nausea-A-AH

## 2023-10-30 ENCOUNTER — Other Ambulatory Visit (HOSPITAL_COMMUNITY): Payer: Self-pay

## 2023-10-30 ENCOUNTER — Other Ambulatory Visit: Payer: Self-pay | Admitting: Family Medicine

## 2023-10-30 ENCOUNTER — Other Ambulatory Visit: Payer: Self-pay

## 2023-10-30 MED ORDER — ONDANSETRON 4 MG PO TBDP
4.0000 mg | ORAL_TABLET | Freq: Three times a day (TID) | ORAL | 0 refills | Status: AC | PRN
  Filled 2023-10-30: qty 20, 7d supply, fill #0

## 2023-10-30 MED ORDER — ONDANSETRON 4 MG PO TBDP: 4.0000 mg | ORAL_TABLET | Freq: Three times a day (TID) | ORAL | 0 refills | Status: AC | PRN

## 2023-10-30 NOTE — Telephone Encounter (Signed)
 Patient can resume the 1mg  dose of ozempic  and stop the 2mg  dose.   I will send antinausea medication to [facility] for her

## 2023-10-30 NOTE — Telephone Encounter (Signed)
 Per DPR, left voicemail on patient's cell advising of Dr. Feliberto response

## 2023-10-30 NOTE — Addendum Note (Signed)
 Addended by: SIMMONS-ROBINSON, Chania Kochanski L on: 10/30/2023 12:17 PM   Modules accepted: Orders

## 2023-11-08 ENCOUNTER — Other Ambulatory Visit: Payer: Self-pay

## 2023-11-08 ENCOUNTER — Other Ambulatory Visit: Payer: Self-pay | Admitting: Family Medicine

## 2023-11-10 ENCOUNTER — Other Ambulatory Visit: Payer: Self-pay

## 2023-11-10 ENCOUNTER — Encounter: Payer: Self-pay | Admitting: Radiology

## 2023-11-10 MED ORDER — DULOXETINE HCL 60 MG PO CPEP
60.0000 mg | ORAL_CAPSULE | Freq: Every day | ORAL | 1 refills | Status: AC
  Filled 2023-11-10: qty 90, 90d supply, fill #0
  Filled 2024-01-28: qty 90, 90d supply, fill #1

## 2023-11-12 ENCOUNTER — Ambulatory Visit: Admitting: Family Medicine

## 2023-11-19 ENCOUNTER — Other Ambulatory Visit: Payer: Self-pay

## 2023-11-19 ENCOUNTER — Other Ambulatory Visit: Payer: Self-pay | Admitting: Family Medicine

## 2023-11-19 MED ORDER — TRUEPLUS 5-BEVEL PEN NEEDLES 32G X 4 MM MISC
1.0000 | Freq: Every day | 3 refills | Status: AC
  Filled 2023-11-19: qty 100, 20d supply, fill #0
  Filled 2024-01-05 (×2): qty 100, 20d supply, fill #1

## 2023-11-21 ENCOUNTER — Other Ambulatory Visit: Payer: Self-pay

## 2023-11-22 ENCOUNTER — Other Ambulatory Visit: Payer: Self-pay

## 2023-11-24 ENCOUNTER — Ambulatory Visit: Admitting: Family Medicine

## 2023-11-24 ENCOUNTER — Other Ambulatory Visit: Payer: Self-pay

## 2023-11-24 ENCOUNTER — Encounter: Payer: Self-pay | Admitting: Family Medicine

## 2023-11-24 VITALS — BP 130/82 | HR 90 | Temp 98.0°F | Ht 60.0 in | Wt 211.0 lb

## 2023-11-24 DIAGNOSIS — J4531 Mild persistent asthma with (acute) exacerbation: Secondary | ICD-10-CM | POA: Diagnosis not present

## 2023-11-24 DIAGNOSIS — J019 Acute sinusitis, unspecified: Secondary | ICD-10-CM

## 2023-11-24 DIAGNOSIS — R051 Acute cough: Secondary | ICD-10-CM

## 2023-11-24 MED ORDER — PREDNISONE 10 MG PO TABS
ORAL_TABLET | ORAL | 0 refills | Status: AC
  Filled 2023-11-24: qty 21, 6d supply, fill #0

## 2023-11-24 MED ORDER — PROMETHAZINE-DM 6.25-15 MG/5ML PO SYRP
2.5000 mL | ORAL_SOLUTION | Freq: Four times a day (QID) | ORAL | 0 refills | Status: DC | PRN
  Filled 2023-11-24: qty 118, 12d supply, fill #0

## 2023-11-24 MED ORDER — AMOXICILLIN-POT CLAVULANATE 875-125 MG PO TABS
1.0000 | ORAL_TABLET | Freq: Two times a day (BID) | ORAL | 0 refills | Status: DC
  Filled 2023-11-24: qty 14, 7d supply, fill #0

## 2023-11-24 NOTE — Progress Notes (Signed)
 Acute Office Visit  Introduced to nurse practitioner role and practice setting.  All questions answered.  Discussed provider/patient relationship and expectations.   Subjective:     Patient ID: Jamie Massey, female    DOB: 08-14-1970, 53 y.o.   MRN: [id number]  Chief Complaint  Patient presents with   Sinusitis    Patient started about 1 week ago with facial pain that turned into nasal congestion productive of green mucus, fever, sore throat, PND.  She now has productive cough that is keeping her up at night.  She has been using her inhaler and nebulizer and started Mucinex yesterday.   Chest congestion    Discussed the use of AI scribe software for clinical note transcription with the patient, who gave verbal consent to proceed.  History of Present Illness Jamie Massey is a 53 year old female with hx of asthma, DMII,  who presents with nasal congestion, cough, and sinus pressure.  She has been experiencing nasal congestion, sinus pressure, and a dry cough for the past week. The symptoms began with facial pain around her eyes, followed by fever and chills two days ago. The nasal congestion is severe, with thick phlegm and occasional epistaxis. The dry cough is persistent and causes dizziness during coughing fits.  She has been using various medications to manage her symptoms, including ibuprofen  initially, followed by Tylenol  for fever and chills, and her albuterol  nebulizer for congestion. She has also been using Flonase  and Mucinex, the latter of which she started taking two days ago, initially at a lower dose but then increased to one tablet every six hours. She reports that she started taking Mucinex two days ago and wonders if it may be contributing to her dry cough.  Her sleep has been significantly affected, with only three hours of sleep last night due to her symptoms. Her granddaughter, grandson, and daughter have had similar symptoms, suggesting a contagious nature of the  illness. Her granddaughter and grandson were tested for COVID-19 and flu, with negative results.  She has a history of asthma, which she describes as mild and typically seasonal. She has not used her albuterol  nebulizer in the past two days but continues to use her inhaler as needed. She also mentions a past experience with Flonase  causing pharyngitis, though she does not believe this is the current issue.  She has a history of diabetes and has stopped taking Ozempic  due to adverse gastrointestinal effects. She is not currently on any medication for nausea and is managing her diabetes with insulin .   Sinusitis    ROS      Objective:    BP 130/82 (BP Location: Left Arm, Patient Position: Sitting, Cuff Size: Large)   Pulse 90   Temp 98 F (36.7 C) (Oral)   Ht 5' (1.524 m)   Wt 211 lb (95.7 kg)   SpO2 99%   BMI 41.21 kg/m    Physical Exam Constitutional:      General: She is not in acute distress.    Appearance: She is well-developed and normal weight. She is not ill-appearing, toxic-appearing or diaphoretic.  HENT:     Head: Normocephalic.     Right Ear: Hearing, tympanic membrane, ear canal and external ear normal. No drainage, swelling or tenderness. No middle ear effusion. Tympanic membrane is not injected or erythematous.     Left Ear: Hearing, ear canal and external ear normal. No drainage, swelling or tenderness.  No middle ear effusion. Tympanic membrane is injected.  Tympanic membrane is not erythematous.     Nose: Mucosal edema, congestion and rhinorrhea present.     Right Turbinates: Enlarged.     Left Turbinates: Enlarged.     Right Sinus: No maxillary sinus tenderness or frontal sinus tenderness.     Left Sinus: No maxillary sinus tenderness or frontal sinus tenderness.     Mouth/Throat:     Mouth: Mucous membranes are moist. No oral lesions.     Pharynx: Uvula midline. Pharyngeal swelling and posterior oropharyngeal erythema present. No oropharyngeal exudate or  uvula swelling.     Tonsils: No tonsillar exudate or tonsillar abscesses. 0 on the right. 0 on the left.  Eyes:     Extraocular Movements:     Right eye: Normal extraocular motion.     Left eye: Normal extraocular motion.     Conjunctiva/sclera: Conjunctivae normal.     Pupils: Pupils are equal, round, and reactive to light.  Cardiovascular:     Rate and Rhythm: Normal rate and regular rhythm.     Heart sounds: No murmur heard.    No gallop.  Pulmonary:     Effort: Pulmonary effort is normal. No respiratory distress.     Breath sounds: Normal breath sounds. No stridor. No wheezing, rhonchi or rales.  Chest:     Chest wall: No tenderness.  Musculoskeletal:     Right lower leg: No edema.     Left lower leg: No edema.  Lymphadenopathy:     Cervical: Cervical adenopathy present.  Skin:    General: Skin is warm and dry.  Neurological:     Mental Status: She is alert.     No results found for any visits on 11/24/23.      Assessment & Plan:  Assessment and Plan Assessment & Plan Acute sinusitis Symptoms persisting for a week, including nasal congestion, facial pain, and postnasal drip.  Fever and chills were present two days ago. Symptoms suggest origin of viral etiology causing asthma exacerbation leading to sinusitis. - Prescribed Augmentin  BID for 7 days for acute sinusitis and asthma exacerbation. - Continue Flonase  twice daily. - Continue Mucinex every 6 hours. - may take tylenol  for pain, limit ibuprofen  - Ensure adequate hydration - gargle with warm salt water, hot tea with honey - Use nasal saline spray to loosen secretions.If symptoms persist or worsen f/u sooner  Asthma with acute exacerbation Asthma exacerbation likely triggered by sinusitis and underlying inflammation. Symptoms include wheezing and increased coughing. Underlying asthma may cause a more severe reaction to infections. - Prescribed prednisone pack for 6 days for asthma exacerbation. - Continue  using albuterol  nebulizer as needed. - Continue prn albuterol  inhaler  Acute cough Dry cough persisting, likely secondary to sinusitis and asthma exacerbation. Coughing episodes cause dizziness, possibly due to sinus inflammation and fluid imbalance. - Prescribed promethazine  DM for acute cough. - Continue hot tea with honey and salt water gargles. - Use nasal saline spray to help with congestion.  Problem List Items Addressed This Visit       Respiratory   Mild persistent asthma with (acute) exacerbation   Relevant Medications   amoxicillin -clavulanate (AUGMENTIN ) 875-125 MG tablet   predniSONE (DELTASONE) 10 MG tablet   promethazine -dextromethorphan (PROMETHAZINE -DM) 6.25-15 MG/5ML syrup   Other Visit Diagnoses       Acute cough    -  Primary   Relevant Medications   amoxicillin -clavulanate (AUGMENTIN ) 875-125 MG tablet     Acute non-recurrent sinusitis, unspecified location  Relevant Medications   amoxicillin -clavulanate (AUGMENTIN ) 875-125 MG tablet   predniSONE (DELTASONE) 10 MG tablet   promethazine -dextromethorphan (PROMETHAZINE -DM) 6.25-15 MG/5ML syrup       Meds ordered this encounter  Medications   amoxicillin -clavulanate (AUGMENTIN ) 875-125 MG tablet    Sig: Take 1 tablet by mouth 2 (two) times daily.    Dispense:  14 tablet    Refill:  0   predniSONE (DELTASONE) 10 MG tablet    Sig: Take 6 tablets (60 mg total) by mouth daily for 1 day, THEN 5 tablets (50 mg total) daily for 1 day, THEN 4 tablets (40 mg total) daily for 1 day, THEN 3 tablets (30 mg total) daily for 1 day, THEN 2 tablets (20 mg total) daily for 1 day, THEN 1 tablet (10 mg total) daily for 1 day.    Dispense:  21 tablet    Refill:  0   promethazine -dextromethorphan (PROMETHAZINE -DM) 6.25-15 MG/5ML syrup    Sig: Take 2.5 mLs by mouth 4 (four) times daily as needed for cough.    Dispense:  118 mL    Refill:  0    Return if symptoms worsen or fail to improve.  Curtis DELENA Boom, FNP  I,  Curtis DELENA Boom, FNP, have reviewed all documentation for this visit. The documentation on 11/24/23 for the exam, diagnosis, procedures, and orders are all accurate and complete.

## 2023-11-25 ENCOUNTER — Ambulatory Visit: Payer: Self-pay

## 2023-11-25 NOTE — Telephone Encounter (Signed)
 Called [facility] to advise them of patient's symptoms/questions.

## 2023-11-25 NOTE — Telephone Encounter (Signed)
 Patient noticed blood sugar high yesterday around 4pm and stayed high until late last night Called ambulance at 12:30am and they wanted to take her to the hospital  Patient just wants something different if available for cough & wanted PCP to advise if she should be taking prednisone and advice on blood sugar elevating    FYI Only or Action Required?: Action required by provider: clinical question for provider, update on patient condition, and patient wants a different cough medicine because what she was given yesterday isnt working and wanted further advice about her blood sugar elevating and wants to know if she should be taking prednisone or not due to the blood sugar elevating high around 497. Blood sugar is 187 during triage.  Patient was last seen in primary care on 11/24/2023 by Wellington Curtis LABOR, FNP.  Called Nurse Triage reporting Hyperglycemia.  Symptoms began yesterday.  Interventions attempted: Prescription medications: insulin  & metformin  and Rest, hydration, or home remedies.  Symptoms are: unchanged.  Triage Disposition: Home Care  Patient/caregiver understands and will follow disposition?: Yes--Patient also wants advice from PCP and a different cough medicine called in due to hers not working                  Copied from CRM #[id number]. Topic: Clinical - Red Word Triage >> Nov 25, 2023  8:38 AM Emylou G wrote: [facility] that prompted transfer to Nurse Triage: Patient was prescribed medications yesterday.. one of which is predniSONE (DELTASONE) 10 MG tablet said it might be causing her high sugar ( 497 ) Reason for Disposition  Blood glucose 70-240 mg/dL (3.9 -86.6 mmol/L)  Answer Assessment - Initial Assessment Questions Patient noticed blood sugar high yesterday around 4pm (220) and stayed high until late last night Called ambulance at 12:30am and they wanted to take her to the hospital Patient's blood sugar was 497 yesterday and around 4am today 187  right now during triage at 8:50am   Patient states last night she had to take fast acting insulin  and also 1.5 tablets of generic benadryl  Patient states that the cough medicine didn't help much at all and she believes Benadryl was the only thing that helped her sleep  She states that her breathing seems better and her chest   Patient stopped Ozempic  a few weeks ago due to nausea  Patient is advised to call us  back if anything changes or with any further questions/concerns. Patient is advised that if anything worsens to go to the Emergency Room. Patient verbalized understanding.    3. USUAL RANGE: What is your glucose level usually? (e.g., usual fasting morning value, usual evening value)     Patient states she usually tries to keep it around 150s 4. KETONES: Do you check for ketones (urine or blood test strips)? If Yes, ask: What does the test show now?      -------- 5. TYPE 1 or 2:  Do you know what type of diabetes you have?  (e.g., Type 1, Type 2, Gestational; doesn't know)      Type 2 6. INSULIN : Do you take insulin ? What type of insulin (s) do you use? What is the mode of delivery? (syringe, pen; injection or pump)?      Ok 32 units yesterday of fast acting insulin  7. DIABETES PILLS: Do you take any pills for your diabetes? If Yes, ask: Have you missed taking any pills recently?     metformin  8. OTHER SYMPTOMS: Do you have any symptoms? (e.g., fever, frequent urination,  difficulty breathing, dizziness, weakness, vomiting)     Patient states nothing different from yesterday  Protocols used: Diabetes - High Blood Sugar-A-AH

## 2023-11-26 ENCOUNTER — Ambulatory Visit: Payer: Self-pay

## 2023-11-26 NOTE — Telephone Encounter (Signed)
 FYI Only or Action Required?: Action required by provider: clinical question for provider and update on patient condition.  Patient was last seen in primary care on 11/24/2023 by Wellington Curtis LABOR, FNP.  Called Nurse Triage reporting Hyperglycemia.  Symptoms began yesterday.  Interventions attempted: Prescription medications: See Mar and Rest, hydration, or home remedies.  Symptoms are: unchanged.  Triage Disposition: See Physician Within 24 Hours, Call PCP Now  Patient/caregiver understands and will follow disposition?: No, wishes to speak with PCP  Copied from CRM #[id number]. Topic: Clinical - Red Word Triage >> Nov 26, 2023  9:02 AM Wess RAMAN wrote: Red Word that prompted transfer to Nurse Triage: Patient's sugar is still running high, congested, and still has a bad cough. Believes it may be the promethazine -dextromethorphan (PROMETHAZINE -DM) 6.25-15 MG/5ML syrup spiking her sugar.  BS: 300s. Yesterday it was up to 497 Reason for Disposition  [1] Caller has URGENT medication or insulin  device (e.g., pump, continuous monitoring) question AND [2] triager unable to answer question  SEVERE coughing spells (e.g., whooping sound after coughing, vomiting after coughing)  Answer Assessment - Initial Assessment Questions Patient reports she is still having a bad cough and chest congestion. Patient is concerned that the cough medication she was given is contributing to her higher blood sugars. Most recent blood sugar is 284-reports she did take her fast acting insulin . No symptoms of high blood sugar.  Patient is asking if she can take OTC options for dextromethorphan and guaifenesin. Wanting to know if she can take an antihistamine at bedtime. Patient is asking for other recommendations from provider.     1. BLOOD GLUCOSE: What is your blood glucose level?      338 at 0450          284 at 8:30 AM 2. ONSET: When did you check the blood glucose?     8:30 AM  284 3. USUAL RANGE: What is  your glucose level usually? (e.g., usual fasting morning value, usual evening value)     150-200 4. KETONES: Do you check for ketones (urine or blood test strips)? If Yes, ask: What does the test show now?      no 5. TYPE 1 or 2:  Do you know what type of diabetes you have?  (e.g., Type 1, Type 2, Gestational; doesn't know)      Type 2  6. INSULIN : Do you take insulin ? What type of insulin (s) do you use? What is the mode of delivery? (syringe, pen; injection or pump)?      Fast acting insulin  and lantus  7. DIABETES PILLS: Do you take any pills for your diabetes? If Yes, ask: Have you missed taking any pills recently?     Metformin  8. OTHER SYMPTOMS: Do you have any symptoms? (e.g., fever, frequent urination, difficulty breathing, dizziness, weakness, vomiting)     no  Answer Assessment - Initial Assessment Questions 1. ONSET: When did the cough begin?      Started a week ago 2. SEVERITY: How bad is the cough today?      moderate 3. SPUTUM: Describe the color of your sputum (e.g., none, dry cough; clear, white, yellow, green)     none 4. HEMOPTYSIS: Are you coughing up any blood? If Yes, ask: How much? (e.g., flecks, streaks, tablespoons, etc.)     no 5. DIFFICULTY BREATHING: Are you having difficulty breathing? If Yes, ask: How bad is it? (e.g., mild, moderate, severe)      no 6. FEVER: Do you have a fever?  If Yes, ask: What is your temperature, how was it measured, and when did it start?     no 7. CARDIAC HISTORY: Do you have any history of heart disease? (e.g., heart attack, congestive heart failure)      no 8. LUNG HISTORY: Do you have any history of lung disease?  (e.g., pulmonary embolus, asthma, emphysema)     yes 9. PE RISK FACTORS: Do you have a history of blood clots? (or: recent major surgery, recent prolonged travel, bedridden)     yes 10. OTHER SYMPTOMS: Do you have any other symptoms? (e.g., runny nose, wheezing, chest pain)        congestion 12. TRAVEL: Have you traveled out of the country in the last month? (e.g., travel history, exposures)       no  Protocols used: Diabetes - High Blood Sugar-A-AH, Cough - Acute Non-Productive-A-AH

## 2023-11-26 NOTE — Telephone Encounter (Signed)
 Spoke with pt and advised. Pt verbalized understanding. Also advised pt can take benadryl to help her cold not for long term per provider no more then 5-7 days.

## 2023-11-26 NOTE — Telephone Encounter (Signed)
Reviewed. Agree with plan as documented

## 2023-11-26 NOTE — Telephone Encounter (Signed)
 Appears blood sugars are improving from previous day. Pt was instructed to stop prednisone. Pt can add a sliding scale to her meal time insulin . She takes 15units of lispro three times a day. See below for adjustment scale. She may do this until her blood sugars return to base line:  Meal time blood glucose 150-200, add 1 unit extra to 15 units Meal time blood glucose  201 -250, add 2 unit extra to 15 units Meal time blood glucose 251 - 300, add 3 unit extra to 15 units  Any further diabetes questions please discuss with PCP.   She can take OTC sugar free cough medication - Guaifenesin and dextromethorphan.

## 2023-11-28 ENCOUNTER — Other Ambulatory Visit: Payer: Self-pay

## 2023-11-28 ENCOUNTER — Other Ambulatory Visit: Payer: Self-pay | Admitting: Family Medicine

## 2023-11-28 ENCOUNTER — Ambulatory Visit: Payer: Self-pay

## 2023-11-28 DIAGNOSIS — R051 Acute cough: Secondary | ICD-10-CM

## 2023-11-28 MED ORDER — HYDROCOD POLI-CHLORPHE POLI ER 10-8 MG/5ML PO SUER
5.0000 mL | Freq: Two times a day (BID) | ORAL | 0 refills | Status: DC | PRN
  Filled 2023-11-28: qty 50, 5d supply, fill #0
  Filled 2023-11-28: qty 115, 12d supply, fill #0

## 2023-11-28 NOTE — Telephone Encounter (Signed)
 Tussionex prescribed for cough. Please advise pt that post infectious cough can last for 8 weeks after upper respiratory infection and to drink warm tea with teaspoon of honey to work as natural antitussive

## 2023-11-28 NOTE — Telephone Encounter (Signed)
 Called and advised patient of dr. Feliberto message. Will reach out if symptoms worsen or fail to improve

## 2023-11-28 NOTE — Telephone Encounter (Signed)
 FYI Only or Action Required?: Action required by provider: clinical question for provider and update on patient condition.  Patient was last seen in primary care on 11/24/2023 by Wellington Curtis LABOR, FNP.  Called Nurse Triage reporting Cough.  Symptoms began several days ago.  Interventions attempted: Prescription medications: amoxicillin -clavulanate, promethazine  cough syrup.  Symptoms are: unchanged.  Triage Disposition: See Physician Within 24 Hours  Patient/caregiver understands and will follow disposition?: No, wishes to speak with PCP      Copied from CRM #[id number]. Topic: Clinical - Red Word Triage >> Nov 28, 2023  8:33 AM Jamie Massey wrote: Red Word that prompted transfer to Nurse Triage: Patients symptoms are worse, congestion, productive cough, green phlegm, she wants to know if her provider can prescribe cough medicine because she can not come in the office. Reason for Disposition  [1] Continuous (nonstop) coughing interferes with work or school AND [2] no improvement using cough treatment per Care Advice  Answer Assessment - Initial Assessment Questions 1. ONSET: When did the cough begin?      X 4 days  2. SEVERITY: How bad is the cough today?      Slightly better this morning, last night cough was worse   3. SPUTUM: Describe the color of your sputum (e.g., none, dry cough; clear, white, yellow, green)     Green   4. HEMOPTYSIS: Are you coughing up any blood? If Yes, ask: How much? (e.g., flecks, streaks, tablespoons, etc.)      Not currently, but was mixed in with the phlegm    5. DIFFICULTY BREATHING: Are you having difficulty breathing? If Yes, ask: How bad is it? (e.g., mild, moderate, severe)      No   6. FEVER: Do you have a fever? If Yes, ask: What is your temperature, how was it measured, and when did it start?     No   10. OTHER SYMPTOMS: Do you have any other symptoms? (e.g., runny nose, wheezing, chest pain)       No   Patient  called in with complaints of productive cough, green phlegm, she wants to know if her provider can prescribe cough medicine because she can not come in the office, and was just seen on 11/17 for similar symptoms. She states the promethazine  is not providing relief and making her Blood sugar high. Please advise.  Protocols used: Cough - Acute Productive-A-AH

## 2023-12-08 ENCOUNTER — Other Ambulatory Visit: Payer: Self-pay

## 2023-12-08 ENCOUNTER — Ambulatory Visit: Admitting: Family Medicine

## 2023-12-08 ENCOUNTER — Encounter: Payer: Self-pay | Admitting: Family Medicine

## 2023-12-08 VITALS — BP 142/88 | HR 88 | Temp 98.2°F | Ht 60.0 in | Wt 212.7 lb

## 2023-12-08 DIAGNOSIS — B372 Candidiasis of skin and nail: Secondary | ICD-10-CM

## 2023-12-08 DIAGNOSIS — E1165 Type 2 diabetes mellitus with hyperglycemia: Secondary | ICD-10-CM | POA: Diagnosis not present

## 2023-12-08 DIAGNOSIS — E1159 Type 2 diabetes mellitus with other circulatory complications: Secondary | ICD-10-CM

## 2023-12-08 DIAGNOSIS — I152 Hypertension secondary to endocrine disorders: Secondary | ICD-10-CM

## 2023-12-08 DIAGNOSIS — R058 Other specified cough: Secondary | ICD-10-CM

## 2023-12-08 DIAGNOSIS — Z794 Long term (current) use of insulin: Secondary | ICD-10-CM

## 2023-12-08 MED ORDER — NYSTATIN 100000 UNIT/GM EX OINT
1.0000 | TOPICAL_OINTMENT | Freq: Two times a day (BID) | CUTANEOUS | 1 refills | Status: DC
  Filled 2023-12-08: qty 30, 15d supply, fill #0
  Filled 2023-12-18: qty 30, 15d supply, fill #1
  Filled ????-??-??: fill #1

## 2023-12-08 NOTE — Progress Notes (Signed)
 ACUTE VISIT   Patient: Jamie Massey   DOB: Jul 31, 1970   53 y.o. Female  MRN: [id number]   PCP: Sharma Coyer, MD  Chief Complaint  Patient presents with   Rash    Patient is present with rash around belly button, around mons pubis and in groin area x 2-3 days. Patient took allergy medication to see if this would help with no improvement. Patient states this does itch, says she is concerned it may be due to medication she is taking for URI. Does state she is still having congestion/ cough    Subjective    HPI HPI     Rash    Additional comments: Patient is present with rash around belly button, around mons pubis and in groin area x 2-3 days. Patient took allergy medication to see if this would help with no improvement. Patient states this does itch, says she is concerned it may be due to medication she is taking for URI. Does state she is still having congestion/ cough       Last edited by Cherry Chiquita HERO, CMA on 12/08/2023 10:42 AM.       Discussed the use of AI scribe software for clinical note transcription with the patient, who gave verbal consent to proceed.  History of Present Illness Jamie Massey is a 53 year old female with diabetes and asthma who presents with a rash and persistent upper respiratory symptoms.  She has developed a rash over the past two to three days, primarily located around her lungs, pubis, and groin area. The rash is itchy and sore, with bothersome bumps. Allergy medication has not provided relief, and she is concerned it may be related to her current medications for an upper respiratory infection.  She has a history of an upper respiratory infection treated two weeks ago with Augmentin  for acute sinusitis and an exacerbation of her asthma, for which she was given prednisone  and promethazine  DM for her cough. She continues to experience cough, congestion, and dry mouth, with symptoms persisting since November 17th. She has been using  Tussinex, but it is no longer available to her due to cost.  She has diabetes and mentions a previous test that showed protein, which she associates with her diabetes. She administers her insulin  shots in her abdomen and has been experiencing itching in the area between her labia and belly, initially thought to be related to her diabetes or insulin  shots. She has been using Diflucan  and a three-day suppository cream kit for yeast, which provided some improvement.  She is currently taking metformin , which was recently increased to four pills. Her blood sugar levels were significantly elevated, reaching up to 500, which she attributes to either the promethazine  DM or prednisone . After discontinuing prednisone , her blood sugar levels decreased to around 300.  She has been through menopause and only has one ovary. She wears loose clothing at home to stay comfortable and reports persistent itching in the groin area. She has tried various topical treatments, including Monistat, A&D ointment, and triamcinolone , but with limited success.     Medications: Outpatient Medications Prior to Visit  Medication Sig   Accu-Chek Softclix Lancets lancets Use 3 (three) times daily for blood sugar testing   albuterol  (PROVENTIL ) (2.5 MG/3ML) 0.083% nebulizer solution Inhale 3 mLs into the lungs every 4 (four) hours for wheezing or shortness of breath   albuterol  (VENTOLIN  HFA) 108 (90 Base) MCG/ACT inhaler Inhale 2 puffs into the lungs every  4 (four) hours as needed for wheezing or shortness of breath.   atorvastatin  (LIPITOR) 20 MG tablet Take 1 tablet (20 mg total) by mouth daily.   carboxymethylcellul-glycerin  (OPTIVE) 0.5-0.9 % ophthalmic solution Apply one drop into both eyes 3 to 4 times a day.   chlorpheniramine-HYDROcodone  (TUSSIONEX) 10-8 MG/5ML Take 5 mLs by mouth every 12 (twelve) hours as needed for cough.   DULoxetine  (CYMBALTA ) 60 MG capsule Take 1 capsule (60 mg total) by mouth daily.   fluticasone   (FLONASE ) 50 MCG/ACT nasal spray Place 2 sprays into both nostrils daily.   glucose blood test strip Use as instructed to monitor glucose three times daily   hydrochlorothiazide  (HYDRODIURIL ) 25 MG tablet Take 1 tablet (25 mg total) by mouth daily for high blood pressure   ibuprofen  (ADVIL ) 800 MG tablet Take 1 tablet (800 mg total) by mouth every 8 (eight) hours as needed for pain. Take with food.   IBUPROFEN  PO Take by mouth.   insulin  glargine (LANTUS ) 100 UNIT/ML Solostar Pen Inject 80 Units into the skin daily for diabetes   insulin  lispro (HUMALOG ) 100 UNIT/ML KwikPen Inject 15 Units into the skin 3 (three) times daily.   Insulin  Pen Needle (TRUEPLUS 5-BEVEL PEN NEEDLES) 32G X 4 MM MISC Use 5 (five) times daily with insulin  injections.   metFORMIN  (GLUCOPHAGE ) 500 MG tablet Take 2 tablets (1,000 mg total) by mouth 2 (two) times daily with a meal.   mometasone -formoterol  (DULERA) 200-5 MCG/ACT AERO Inhale 2 puffs into the lungs 2 (two) times daily for asthma. Rinse and spit with water after each use.   ondansetron  (ZOFRAN -ODT) 4 MG disintegrating tablet Take 1 tablet (4 mg total) by mouth every 8 (eight) hours as needed for nausea or vomiting.   ondansetron  (ZOFRAN -ODT) 4 MG disintegrating tablet Dissolve 1 tablet (4 mg total) by mouth every 8 (eight) hours as needed for nausea or vomiting.   promethazine -dextromethorphan (PROMETHAZINE -DM) 6.25-15 MG/5ML syrup Take 2.5 mLs by mouth 4 (four) times daily as needed for cough.   triamcinolone  (NASACORT ) 55 MCG/ACT AERO nasal inhaler Place 2 sprays into the nose daily.   triamcinolone  ointment (KENALOG ) 0.1 % Apply 1 Application topically 2 (two) times daily.   [DISCONTINUED] amoxicillin -clavulanate (AUGMENTIN ) 875-125 MG tablet Take 1 tablet by mouth 2 (two) times daily.   No facility-administered medications prior to visit.    Last CBC Lab Results  Component Value Date   WBC 7.8 10/22/2023   HGB 11.1 10/22/2023   HCT 33.6 (L) 10/22/2023    MCV 92 10/22/2023   MCH 30.5 10/22/2023   RDW 13.8 10/22/2023   PLT 281 10/22/2023   Last metabolic panel Lab Results  Component Value Date   GLUCOSE 169 (H) 10/22/2023   NA 140 10/22/2023   K 3.5 10/22/2023   CL 100 10/22/2023   CO2 23 10/22/2023   BUN 22 10/22/2023   CREATININE 1.13 (H) 10/22/2023   EGFR 57 (L) 10/22/2023   CALCIUM  9.6 10/22/2023   PROT 7.0 10/22/2023   ALBUMIN 4.5 10/22/2023   LABGLOB 2.5 10/22/2023   BILITOT 0.6 10/22/2023   ALKPHOS 126 10/22/2023   AST 23 10/22/2023   ALT 22 10/22/2023   ANIONGAP 11 07/16/2023   Last lipids Lab Results  Component Value Date   CHOL 147 04/23/2023   HDL 35 (L) 04/23/2023   LDLCALC 86 04/23/2023   TRIG 149 04/23/2023   CHOLHDL 4.2 04/23/2023   Last hemoglobin A1c Lab Results  Component Value Date   HGBA1C 7.4 (H)  10/22/2023   Last thyroid  functions No results found for: TSH, T3TOTAL, T4TOTAL, FREET4, THYROIDAB Last vitamin D No results found for: 25OHVITD2, 25OHVITD3, VD25OH Last vitamin B12 and Folate Lab Results  Component Value Date   VITAMINB12 413 10/22/2023        Objective    BP (!) 142/88 (BP Location: Right Arm, Patient Position: Sitting, Cuff Size: Normal)   Pulse 88   Temp 98.2 F (36.8 C) (Oral)   Ht 5' (1.524 m)   Wt 212 lb 11.2 oz (96.5 kg)   SpO2 99%   BMI 41.54 kg/m  BP Readings from Last 3 Encounters:  12/08/23 (!) 142/88  11/24/23 130/82  10/22/23 124/80   Wt Readings from Last 3 Encounters:  12/08/23 212 lb 11.2 oz (96.5 kg)  11/24/23 211 lb (95.7 kg)  10/22/23 215 lb 3.2 oz (97.6 kg)      Physical Exam   Physical Exam CHEST: Clear to auscultation bilaterally. No wheezes, rhonchi, or crackles. SKIN: Mild erythema and erythematous papules in subcutaneous tissue and bilateral inguinal folds. Small erythema and erythematous papules surrounding umbilicus. Satellite lesions present.    No results found for any visits on 12/08/23.  Assessment & Plan      Assessment and Plan Assessment & Plan Candidiasis of skin acute Rash around the pubis, groin, and umbilicus for 2-3 days, itchy and sore with erythematous papules and satellite lesions. Likely fungal infection due to warm, moist environment and possibly exacerbated by diabetes. Differential includes allergic reaction, but less likely due to rash distribution. - Prescribed nystatin ointment for topical application three times a day, preferably after showering.  Type 2 diabetes mellitus Chronic  Recent medication changes including metformin  and insulin . Blood sugar levels elevated, likely due to prednisone  use. Itching in the groin area possibly related to diabetes and insulin  injections. - Continue current diabetes management plan.  Cough subacute Persistent cough with congestion and phlegm production since November 17th, likely post-viral. Previous treatment with promethazine  DM was effective but caused elevated blood sugar levels. Current management includes hydration and warm drinks to aid phlegm clearance. - Continue hydration and warm drinks to aid phlegm clearance. - Use promethazine  DM as needed for cough, considering cost and insurance coverage.  Asthma Acute on chronic exacerbation, improving  Recent exacerbation treated with prednisone . Current symptoms include improved breathing but persistent cough. Requires albuterol  inhaler for symptom management. - Refilled albuterol  inhaler prescription.      Return in about 3 months (around 03/07/2024) for Chronic F/U, HTN, DM.        Rockie Agent, MD  [facility] [phone] (phone) [phone] (fax)  [facility]

## 2023-12-08 NOTE — Patient Instructions (Signed)
 To keep you healthy, please keep in mind the following health maintenance items that you are due for:   Health Maintenance Due  Topic Date Due   Pneumococcal Vaccine: 50+ Years (1 of 2 - PCV) Never done   Hepatitis B Vaccines 19-59 Average Risk (1 of 3 - 19+ 3-dose series) Never done   Colonoscopy  Never done   Zoster Vaccines- Shingrix (1 of 2) Never done   Mammogram  07/24/2019   Cervical Cancer Screening (HPV/Pap Cotest)  06/23/2023   OPHTHALMOLOGY EXAM  06/24/2023     Best Wishes,   Dr. Lang

## 2023-12-11 ENCOUNTER — Ambulatory Visit: Payer: Self-pay

## 2023-12-11 NOTE — Telephone Encounter (Signed)
 FYI Only or Action Required?: Action required by provider: clinical question for provider and update on patient condition.  Patient was last seen in primary care on 12/08/2023 by Sharma Coyer, MD.  Called Nurse Triage reporting Cough and Hyperglycemia.  Symptoms began several days ago.  Interventions attempted: Prescription medications: See MAR and Rest, hydration, or home remedies.  Symptoms are: unchanged.  Triage Disposition: Call PCP Now, See PCP When Office is Open (Within 3 Days)  Patient/caregiver understands and will follow disposition?: No, wishes to speak with PCP  Copied from CRM #[id number]. Topic: Clinical - Red Word Triage >> Dec 11, 2023  2:14 PM Geneva B wrote: [facility] that prompted transfer to Nurse Triage: pt blood sugar was 340 the last 3 days Reason for Disposition  Cough has been present for > 3 weeks  [1] Blood glucose > 300 mg/dL (83.2 mmol/L) AND [7] two or more times in a row  Answer Assessment - Initial Assessment Questions Reports continued cough with greenish yellow sputum. Patient states her blood sugars having been elevated to 340 every morning for the past three days. Patient is questioning if there is something else she can take to help with the continued cough. Patient couldn't say what her current blood sugar was. Patient is asking for a call back from the office.   1. ONSET: When did the cough begin?      Started in mid November 2. SEVERITY: How bad is the cough today?      mild 3. SPUTUM: Describe the color of your sputum (e.g., none, dry cough; clear, white, yellow, green)     Greenish yellow 4. HEMOPTYSIS: Are you coughing up any blood? If Yes, ask: How much? (e.g., flecks, streaks, tablespoons, etc.)     no 5. DIFFICULTY BREATHING: Are you having difficulty breathing? If Yes, ask: How bad is it? (e.g., mild, moderate, severe)      no 6. FEVER: Do you have a fever? If Yes, ask: What is your temperature, how was it  measured, and when did it start?     no 7. CARDIAC HISTORY: Do you have any history of heart disease? (e.g., heart attack, congestive heart failure)      no 8. LUNG HISTORY: Do you have any history of lung disease?  (e.g., pulmonary embolus, asthma, emphysema)     no 9. PE RISK FACTORS: Do you have a history of blood clots? (or: recent major surgery, recent prolonged travel, bedridden)     no 10. OTHER SYMPTOMS: Do you have any other symptoms? (e.g., runny nose, wheezing, chest pain)       Runny nose 12. TRAVEL: Have you traveled out of the country in the last month? (e.g., travel history, exposures)       no  Answer Assessment - Initial Assessment Questions 1. BLOOD GLUCOSE: What is your blood glucose level?      Unable to say her current blood sugar but states her blood sugar has been 340 for the past three mornings 2. ONSET: When did you check the blood glucose?     Typically when she first wakes up 5. TYPE 1 or 2:  Do you know what type of diabetes you have?  (e.g., Type 1, Type 2, Gestational; doesn't know)      Type 2 6. INSULIN : Do you take insulin ? What type of insulin (s) do you use? What is the mode of delivery? (syringe, pen; injection or pump)?      yes 7. DIABETES PILLS: Do you  take any pills for your diabetes? If Yes, ask: Have you missed taking any pills recently?     yes 8. OTHER SYMPTOMS: Do you have any symptoms? (e.g., fever, frequent urination, difficulty breathing, dizziness, weakness, vomiting)     Frequent thirst  Protocols used: Cough - Acute Productive-A-AH, Diabetes - High Blood Sugar-A-AH

## 2023-12-12 ENCOUNTER — Other Ambulatory Visit: Payer: Self-pay | Admitting: Family Medicine

## 2023-12-12 ENCOUNTER — Other Ambulatory Visit: Payer: Self-pay

## 2023-12-12 ENCOUNTER — Ambulatory Visit: Payer: Self-pay

## 2023-12-12 MED ORDER — HYDROCODONE BIT-HOMATROP MBR 5-1.5 MG/5ML PO SOLN
5.0000 mL | Freq: Three times a day (TID) | ORAL | 0 refills | Status: DC | PRN
  Filled 2023-12-12: qty 120, 8d supply, fill #0

## 2023-12-12 MED ORDER — FLUCONAZOLE 150 MG PO TABS
ORAL_TABLET | ORAL | 0 refills | Status: DC
  Filled 2023-12-12: qty 2, 3d supply, fill #0

## 2023-12-12 NOTE — Telephone Encounter (Signed)
 FYI Only or Action Required?: Action required by provider: request for appointment, clinical question for provider, and upset she has not received follow up from yesterdays triage call, requesting call back today from clinic.  Patient was last seen in primary care on 12/08/2023 by Sharma Coyer, MD.  Called Nurse Triage reporting Rash.  Symptoms began several days ago.  Interventions attempted: Prescription medications: Nystatin .  Symptoms are: gradually worsening.  Triage Disposition: Call PCP Within 24 Hours  Patient/caregiver understands and will follow disposition?: No, wishes to speak with PCP   Copied from CRM #[id number]. Topic: Clinical - Red Word Triage >> Dec 12, 2023  1:44 PM Shanda MATSU wrote: Red Word that prompted transfer to Nurse Triage: Patient is reporting that rash she saw the provider for on 12/01 seems to be getting worse, itching has subsided a little but not the actual rash. Patient stated she was adv yesterday that someone would be calling her back but no one ever did. Reason for Disposition  [1] Applying cream or ointment AND [2] causes severe itch, burning or pain  Answer Assessment - Initial Assessment Questions Additional info: Patient was evaluated on 12/08/23 prescribed nystatin  for yeast rash on her abdomen. She is calling today to see if pcp can see her between 3-4 as the medication is making her rash more painful and has more bumps not spreading more bumps in same area. No appointments are available in office today. Advised message will be sent to pcp for follow up, she stated that she is still pending a call back from her call yesterday and nobody has returned her call, see nurse triage encounter dated 12/11/23.  Please follow up with [facility] today [phone]    1. APPEARANCE of RASH: What does the rash look like? (e.g., blisters, dry flaky skin, red spots, redness, sores)     Small red bumps 2. LOCATION: Where is the rash located?       umbilicus 3. NUMBER: How many spots are there?      Several small bumps 4. SIZE: How big are the spots? (e.g., inches, cm; or compare to size of pinhead, tip of pen, eraser, pea)      small 5. ONSET: When did the rash start?      Several days ago 6. ITCHING: Does the rash itch? If Yes, ask: How bad is the itch?  (Scale 0-10; or none, mild, moderate, severe)     yes 7. PAIN: Does the rash hurt? If Yes, ask: How bad is the pain?  (Scale 0-10; or none, mild, moderate, severe)     Denies  8. OTHER SYMPTOMS: Do you have any other symptoms? (e.g., fever)     Denies 9. PREGNANCY: Is there any chance you are pregnant? When was your last menstrual period?  Protocols used: Rash or Redness - Localized-A-AH

## 2023-12-12 NOTE — Addendum Note (Signed)
 Addended by: SIMMONS-ROBINSON, Kami Kube L on: 12/12/2023 04:36 PM   Modules accepted: Orders

## 2023-12-12 NOTE — Telephone Encounter (Signed)
  She can try cetirizine 10mg  daily and diflucan  150 mg today and repeat in 72 hours.   If no improvement, I would recommend a dermatology evaluation

## 2023-12-12 NOTE — Telephone Encounter (Signed)
 For blood sugar, she can increase her humalog  to 18 units with meals and continue to titrate up by 2 units every 3 days until glucose 2 hours after meals is less than 200 to a max dose of 28 units of humalog .   Continue lantus  80 units daily for now    I will send another cough suppressant but please advise pt that post infectious cost can last for 8 weeks.

## 2023-12-12 NOTE — Telephone Encounter (Signed)
 Spoke with patient and gave advisement from Dr. Lang for both encounters

## 2023-12-17 ENCOUNTER — Other Ambulatory Visit: Payer: Self-pay

## 2023-12-17 ENCOUNTER — Ambulatory Visit: Payer: Self-pay

## 2023-12-17 ENCOUNTER — Other Ambulatory Visit: Payer: Self-pay | Admitting: Family Medicine

## 2023-12-17 NOTE — Telephone Encounter (Signed)
 FYI Only or Action Required?: Action required by provider: update on patient condition. Requesting Nystatin  refill and if she needs more Diflucan    Patient was last seen in primary care on 12/08/2023 by Sharma Coyer, MD.  Called Nurse Triage reporting Vaginitis.  Symptoms began a week ago.  Interventions attempted: Prescription medications: diflucan  and nystatin .  Symptoms are: gradually improving.  Triage Disposition: See PCP When Office is Open (Within 3 Days)  Patient/caregiver understands and will follow disposition?: Yes  Message from Harlene ORN sent at 12/17/2023 10:29 AM EST  Reason for Triage: Patient called. Her pcp treated for yeast infection on her stomach was put on medication, but is still having vaginal issues.   Reason for Disposition  [1] Symptoms of a yeast infection (i.e., itchy, white discharge, not bad smelling) AND [2] not improved > 3 days following Care Advice  Answer Assessment - Initial Assessment Questions Vaginal and leg crease- itching, bumps different from the rash on her stomach. Taken both doses of Diflucan  with mild improvement my symptoms still present  Rash on stomach not resolved at this time. Slight improvement.   Patient asking if more nystatin  cream can be sent in and if provider thinks she needs more Diflucan  since she is still itching a moderate amount in vaginal area and leg crease.    1. DISCHARGE: Describe the discharge. (e.g., white, yellow, green, gray, foamy, cottage cheese-like)     clear 2. ODOR: Is there a bad odor?     denies 3. ONSET: When did the discharge begin?     Ongoing  4. RASH: Is there a rash in the genital area? If Yes, ask: Describe it. (e.g., redness, blisters, sores, bumps)     Bumps on the top of vaginal area and in leg crease- looks different from rash on stomach  5. ABDOMEN PAIN: Are you having any abdomen pain? If Yes, ask: What does it feel like?  (e.g., crampy, dull, intermittent,  constant)      denies 6. ABDOMEN PAIN SEVERITY: If present, ask: How bad is it? (e.g., Scale 1-10; mild, moderate, or severe)     NA 7. CAUSE: What do you think is causing the discharge? Have you had the same problem before? What happened then?     Yeast infection not resolved 8. OTHER SYMPTOMS: Do you have any other symptoms? (e.g., fever, itching, urination pain, vaginal bleeding, vaginal foreign body)     Itching -- denies fever, urinary symptoms.  Protocols used: Vaginal Discharge-A-AH

## 2023-12-18 ENCOUNTER — Other Ambulatory Visit: Payer: Self-pay | Admitting: Family Medicine

## 2023-12-18 ENCOUNTER — Other Ambulatory Visit: Payer: Self-pay

## 2023-12-18 ENCOUNTER — Telehealth: Payer: Self-pay

## 2023-12-18 DIAGNOSIS — B372 Candidiasis of skin and nail: Secondary | ICD-10-CM

## 2023-12-18 MED ORDER — FLUCONAZOLE 150 MG PO TABS
150.0000 mg | ORAL_TABLET | ORAL | 0 refills | Status: DC
  Filled 2023-12-18: qty 4, 28d supply, fill #0

## 2023-12-18 MED ORDER — NYSTATIN 100000 UNIT/GM EX OINT
1.0000 | TOPICAL_OINTMENT | Freq: Two times a day (BID) | CUTANEOUS | 1 refills | Status: AC
  Filled 2023-12-18: qty 30, 15d supply, fill #0

## 2023-12-18 NOTE — Telephone Encounter (Signed)
 Patient used what was sent in on 12/08/23 and 12/12/23 but is still having trouble.  Vaginally and rash on abdomen that was being treated as fungal is not any better    Copied from CRM #[id number]. Topic: Clinical - Medication Question >> Dec 18, 2023  9:13 AM Myrick T wrote: Reason for CRM: patient called to see if she was getting medication for her vaginal symptoms. Please f/u with patient

## 2023-12-18 NOTE — Progress Notes (Signed)
 Once weekly diflucan  prescribed for the next four weeks to help with her symptoms both rash and vaginal symptoms  I also recommend dermatology referral if these symptoms are not improved

## 2023-12-18 NOTE — Telephone Encounter (Signed)
 Dr. Lang:  Once weekly diflucan  prescribed for the next four weeks to help with her symptoms both rash and vaginal symptoms   I also recommend dermatology referral if these symptoms are not improved    I spoke with pt, was advised provider recommendation. Pt verbalized understanding if sx's do not improved to let us  know so we can send referral to dermatology.

## 2023-12-19 ENCOUNTER — Other Ambulatory Visit: Payer: Self-pay

## 2023-12-29 ENCOUNTER — Other Ambulatory Visit: Payer: Self-pay

## 2023-12-29 ENCOUNTER — Ambulatory Visit: Payer: Self-pay | Admitting: Family Medicine

## 2023-12-29 DIAGNOSIS — J4531 Mild persistent asthma with (acute) exacerbation: Secondary | ICD-10-CM

## 2023-12-29 MED ORDER — ALBUTEROL SULFATE HFA 108 (90 BASE) MCG/ACT IN AERS
2.0000 | INHALATION_SPRAY | RESPIRATORY_TRACT | 6 refills | Status: AC | PRN
  Filled 2023-12-29: qty 18, 25d supply, fill #0
  Filled 2023-12-31: qty 6.7, 17d supply, fill #0

## 2023-12-29 MED ORDER — MOMETASONE FURO-FORMOTEROL FUM 200-5 MCG/ACT IN AERO
2.0000 | INHALATION_SPRAY | Freq: Two times a day (BID) | RESPIRATORY_TRACT | 1 refills | Status: AC
  Filled 2023-12-29 – 2024-01-10 (×2): qty 13, 30d supply, fill #0

## 2023-12-29 NOTE — Telephone Encounter (Signed)
 Other than 3 days max of Afrin, I do not have many recommendations for congestion.   I suspect this is likely a viral illness and will likely reset the clock on the post-infectious cough that we discussed can last 8 weeks following an infection. Please continue to hydrate and take the prescribed cough medication.   I have refilled her dulera  and albuterol  inhalers in the meantime. Please advise for office visit for re-evaluation if symptoms worsened after 7 days

## 2023-12-29 NOTE — Telephone Encounter (Signed)
 FYI Only or Action Required?: Action required by provider: update on patient condition and Requesting something for congestion, and a refill on inhaler. Pharmacy confirmed  Patient was last seen in primary care on 12/08/2023 by Sharma Coyer, MD.  Called Nurse Triage reporting URI.  Symptoms began several days ago.  Interventions attempted: OTC medications: motrin , tylenol  and Prescription medications: hydrocan .  Symptoms are: gradually worsening.  Triage Disposition: See PCP When Office is Open (Within 3 Days)  Patient/caregiver understands and will follow disposition?: No, wishes to speak with PCP  Message from Amber H sent at 12/29/2023  8:02 AM EST  Reason for Triage: Patient complaining of headache, sinus, congestion green sputum, been around grandson who had flu, pain behind right eye, pain in right ear, had a cough but went away yesterday.   [phone]      Reason for Disposition  [1] Sinus congestion (pressure, fullness) AND [2] present > 10 days  Answer Assessment - Initial Assessment Questions Recently finished treatment for similar symptoms. Daughter and grandson were at her place and found out they have the flu- they are currently very sick.   Previous infection- just got over it a few weeks ago- never fully got over her sinus/chest congestion but was almost resolved Now having lots of sinus pressure- behind her eyes and forehead Ears were hurting yesterday but not today  Coughing yesterday- stopped-- took Hydrocan- now only coughing to get stuff up. Lung congestion- slight inspiratory wheeze. More like she needs to clear her chest. Lost her inhaler somehow.  Spitting a lot up- mucous- milky green  Denies fever but takes ibuprofen  chronically, and tylenol  for hip pain  Motrin , tylenol . Hydrocan for cough yesterday but none otherwise.  All the same as before but not as severe.   Asking for something for sinus/chest congestion and her inhaler  (lost it) somehow. If she gets worse then she will go to UC but trying not to expose self or others anymore than has.  Pharmacy confirmed.  UPDATE on previous--Diflucan - rash on stomach has almost cleared --    1. LOCATION: Where does it hurt?      Behind her eye  2. ONSET: When did the sinus pain start?  (e.g., hours, days)      3 days ago  3. SEVERITY: How bad is the pain?   (Scale 0-10; or none, mild, moderate or severe)     4/10  4. RECURRENT SYMPTOM: Have you ever had sinus problems before? If Yes, ask: When was the last time? and What happened that time?      Just got over something like this a week or two ago but never fully cleared 5. NASAL CONGESTION: Is the nose blocked? If Yes, ask: Can you open it or must you breathe through your mouth?     Bilateral congestion  6. NASAL DISCHARGE: Do you have discharge from your nose? If so ask, What color?     Milky green  7. FEVER: Do you have a fever? If Yes, ask: What is it, how was it measured, and when did it start?      denies 8. OTHER SYMPTOMS: Do you have any other symptoms? (e.g., sore throat, cough, earache, difficulty breathing)     Mild cough, scratching throat and cough in the beginning  Protocols used: Sinus Pain or Congestion-A-AH

## 2023-12-29 NOTE — Addendum Note (Signed)
 Addended by: SIMMONS-ROBINSON, Avonda Toso L on: 12/29/2023 03:51 PM   Modules accepted: Orders

## 2023-12-29 NOTE — Telephone Encounter (Signed)
 Spoke with patient and gave provider's advisement. Patient states she has flonase  at home and will use that to help with sinus congestion and pressure. Patient agrees to make OV to see PCP if symptoms worsen or fail to improve after 7 days

## 2023-12-30 ENCOUNTER — Other Ambulatory Visit: Payer: Self-pay

## 2023-12-30 ENCOUNTER — Other Ambulatory Visit: Payer: Self-pay | Admitting: Family Medicine

## 2023-12-31 ENCOUNTER — Other Ambulatory Visit: Payer: Self-pay

## 2024-01-02 ENCOUNTER — Other Ambulatory Visit: Payer: Self-pay

## 2024-01-05 ENCOUNTER — Other Ambulatory Visit: Payer: Self-pay

## 2024-01-05 ENCOUNTER — Other Ambulatory Visit: Payer: Self-pay | Admitting: Family Medicine

## 2024-01-05 MED ORDER — IBUPROFEN 800 MG PO TABS
800.0000 mg | ORAL_TABLET | Freq: Three times a day (TID) | ORAL | 1 refills | Status: DC | PRN
  Filled 2024-01-05: qty 100, 34d supply, fill #0

## 2024-01-05 NOTE — Telephone Encounter (Signed)
 Copied from CRM [id number]. Topic: Clinical - Medication Question >> Jan 05, 2024  1:33 PM Jamie Massey wrote: Reason for CRM: Patient called in regarding prescription Ibuprofen  800 MG , informed her that we did receive it today and is pending. Patient states she needs it by the end of the day today would like for a nurse to give her a callback regarding this

## 2024-01-06 ENCOUNTER — Other Ambulatory Visit: Payer: Self-pay

## 2024-01-06 ENCOUNTER — Other Ambulatory Visit (HOSPITAL_COMMUNITY): Payer: Self-pay

## 2024-01-06 MED ORDER — IBUPROFEN 800 MG PO TABS: 800.0000 mg | ORAL_TABLET | Freq: Three times a day (TID) | ORAL | 1 refills | Status: AC | PRN

## 2024-01-09 ENCOUNTER — Other Ambulatory Visit: Payer: Self-pay

## 2024-01-10 ENCOUNTER — Other Ambulatory Visit: Payer: Self-pay

## 2024-01-13 ENCOUNTER — Ambulatory Visit: Admitting: Family Medicine

## 2024-01-13 NOTE — Progress Notes (Deleted)
" ° °  Established Patient Office Visit  Patient ID: Jamie Massey, female    DOB: 1970-09-24  Age: 54 y.o. MRN: [id number] PCP: Sharma Coyer, MD  No chief complaint on file.   Subjective:     HPI  Discussed the use of AI scribe software for clinical note transcription with the patient, who gave verbal consent to proceed.  History of Present Illness    {History (Optional):23778}  ROS    Objective:     There were no vitals taken for this visit. {Vitals History (Optional):23777}  Physical Exam  {PhysExam Abridge (Optional):210964309} No results found for any visits on 01/13/24.  {Labs (Optional):23779}  The 10-year ASCVD risk score (Arnett DK, et al., 2019) is: 7%    Assessment & Plan:   Problem List Items Addressed This Visit     Hyperlipidemia associated with type 2 diabetes mellitus (HCC)   Hypertension associated with diabetes (HCC)   Type 2 diabetes mellitus (HCC) - Primary   Other Visit Diagnoses       Intertriginous candidiasis           Assessment and Plan Assessment & Plan     No follow-ups on file.    Coyer Sharma, MD [facility]   "

## 2024-01-21 ENCOUNTER — Other Ambulatory Visit: Payer: Self-pay

## 2024-01-28 ENCOUNTER — Other Ambulatory Visit: Payer: Self-pay

## 2024-02-02 ENCOUNTER — Ambulatory Visit: Admitting: Family Medicine

## 2024-02-04 ENCOUNTER — Other Ambulatory Visit: Payer: Self-pay

## 2024-02-05 ENCOUNTER — Telehealth: Payer: Self-pay

## 2024-02-05 ENCOUNTER — Encounter: Payer: Self-pay | Admitting: Family Medicine

## 2024-02-05 ENCOUNTER — Other Ambulatory Visit: Payer: Self-pay

## 2024-02-05 ENCOUNTER — Ambulatory Visit: Admitting: Family Medicine

## 2024-02-05 VITALS — BP 150/84 | HR 88 | Ht 60.0 in | Wt 213.4 lb

## 2024-02-05 DIAGNOSIS — G4709 Other insomnia: Secondary | ICD-10-CM | POA: Diagnosis not present

## 2024-02-05 DIAGNOSIS — E785 Hyperlipidemia, unspecified: Secondary | ICD-10-CM

## 2024-02-05 DIAGNOSIS — M25512 Pain in left shoulder: Secondary | ICD-10-CM

## 2024-02-05 DIAGNOSIS — L309 Dermatitis, unspecified: Secondary | ICD-10-CM | POA: Diagnosis not present

## 2024-02-05 DIAGNOSIS — E1159 Type 2 diabetes mellitus with other circulatory complications: Secondary | ICD-10-CM

## 2024-02-05 DIAGNOSIS — Z794 Long term (current) use of insulin: Secondary | ICD-10-CM

## 2024-02-05 DIAGNOSIS — E1165 Type 2 diabetes mellitus with hyperglycemia: Secondary | ICD-10-CM | POA: Diagnosis not present

## 2024-02-05 DIAGNOSIS — J4531 Mild persistent asthma with (acute) exacerbation: Secondary | ICD-10-CM

## 2024-02-05 DIAGNOSIS — I152 Hypertension secondary to endocrine disorders: Secondary | ICD-10-CM | POA: Diagnosis not present

## 2024-02-05 DIAGNOSIS — G8929 Other chronic pain: Secondary | ICD-10-CM | POA: Diagnosis not present

## 2024-02-05 DIAGNOSIS — E1169 Type 2 diabetes mellitus with other specified complication: Secondary | ICD-10-CM | POA: Diagnosis not present

## 2024-02-05 DIAGNOSIS — L089 Local infection of the skin and subcutaneous tissue, unspecified: Secondary | ICD-10-CM | POA: Diagnosis not present

## 2024-02-05 DIAGNOSIS — Z1231 Encounter for screening mammogram for malignant neoplasm of breast: Secondary | ICD-10-CM

## 2024-02-05 MED ORDER — LIDOCAINE 5 % EX PTCH
1.0000 | MEDICATED_PATCH | CUTANEOUS | 0 refills | Status: AC
  Filled 2024-02-05 – 2024-02-10 (×2): qty 30, 30d supply, fill #0

## 2024-02-05 MED ORDER — GLUCOSE BLOOD VI STRP
ORAL_STRIP | 12 refills | Status: AC
  Filled 2024-02-05: qty 100, 30d supply, fill #0
  Filled 2024-02-07: qty 50, 17d supply, fill #0
  Filled 2024-02-07: qty 50, 16d supply, fill #0

## 2024-02-05 MED ORDER — GLIPIZIDE ER 5 MG PO TB24
5.0000 mg | ORAL_TABLET | Freq: Every day | ORAL | 3 refills | Status: AC
  Filled 2024-02-05: qty 90, 90d supply, fill #0

## 2024-02-05 MED ORDER — CLOBETASOL PROPIONATE 0.05 % EX CREA
1.0000 | TOPICAL_CREAM | Freq: Two times a day (BID) | CUTANEOUS | 3 refills | Status: AC
  Filled 2024-02-05: qty 30, 30d supply, fill #0

## 2024-02-05 MED ORDER — INSULIN LISPRO (1 UNIT DIAL) 100 UNIT/ML (KWIKPEN)
15.0000 [IU] | PEN_INJECTOR | Freq: Three times a day (TID) | SUBCUTANEOUS | 6 refills | Status: AC
  Filled 2024-02-05: qty 15, 34d supply, fill #0

## 2024-02-05 NOTE — Patient Instructions (Signed)
 To keep you healthy, please keep in mind the following health maintenance items that you are due for:   Health Maintenance Due  Topic Date Due   Pneumococcal Vaccine: 50+ Years (1 of 2 - PCV) Never done   Hepatitis B Vaccines 19-59 Average Risk (1 of 3 - 19+ 3-dose series) Never done   Zoster Vaccines- Shingrix (1 of 2) Never done   Colonoscopy  Never done   Mammogram  07/24/2019   Cervical Cancer Screening (HPV/Pap Cotest)  06/23/2023   OPHTHALMOLOGY EXAM  06/24/2023   COVID-19 Vaccine (4 - 2025-26 season) 09/08/2023     Best Wishes,   Dr. Lang

## 2024-02-05 NOTE — Progress Notes (Signed)
 Care Guide Pharmacy Note  02/05/2024 Name: Jamie Massey MRN: [id number] DOB: 05-17-1970  Referred By: Sharma Coyer, MD Reason for referral: Complex Care Management and Call Attempt #1 (Unsuccessful initial outreach to schedule with PHARM D- Allyson)   Jamie Massey is a 54 y.o. year old female who is a primary care patient of Simmons-Robinson, Coyer, MD.  Jamie Massey was referred to the pharmacist for assistance related to: DMII  An unsuccessful telephone outreach was attempted today to contact the patient who was referred to the pharmacy team for assistance with Disease management. Additional attempts will be made to contact the patient.  Leotis Rase [facility], [facility] Guide  Direct Dial : [phone]  Fax [phone]

## 2024-02-05 NOTE — Progress Notes (Signed)
 "  Established Patient Office Visit  Patient ID: Jamie Massey, female    DOB: 07-01-1970  Age: 54 y.o. MRN: [id number] PCP: Sharma Coyer, MD  Chief Complaint  Patient presents with   Care Management    Have req records for DM eye exam from [facility] eye    Medical Management of Chronic Issues    Patient is present for f/u DM, HTN, HLD. Had some concerns with blood sugar, has been running high lately  Some concerns with recurrent rash, states she would like something to help with the itchy dry skin the area     Subjective:     HPI  Discussed the use of AI scribe software for clinical note transcription with the patient, who gave verbal consent to proceed.  History of Present Illness Jamie Massey is a 54 year old female with type 2 diabetes, hypertension, and hyperlipidemia who presents for follow-up of elevated blood sugars and a recurrent itchy rash.  She has been experiencing elevated blood sugars, with fasting glucose levels between 190 and 200 mg/dL, necessitating increased insulin  doses. Her current medications include metformin  1000 mg twice daily, glargine 80 units, and Humalog  insulin  15 units three times a day. She frequently experiences hunger, which she attributes to her depression medication. She reports that she cannot take Ozempic , Rybelsus , or Mounjaro because they cause her severe gastrointestinal side effects.  She describes a recurrent itchy rash with bumps on her abdomen, which she manages with cream and Vaseline. The rash itches intensely but improves with these treatments. She has a history of dry skin, which she manages with Dove soap and moisturizing creams. She suspects the rash may be eczema, similar to what her mother experienced.  She experiences chronic left shoulder pain, which has not improved with Motrin  or shoulder injections. The pain impacts her ability to perform heavy cleaning tasks, contributing to stress. She has had multiple surgeries in the  past and is reluctant to undergo further surgical interventions.  She reports daily phlegm production upon waking, which resolves after using her inhaler. She has not yet started using Dulera , which she recently picked up. She has tried various allergy medications without success, except for guaifenesin, which she found effective.  She mentions emotional stress and difficulty sleeping, sometimes requiring medication to aid sleep. Her depression worsens without Cymbalta , which she has been taking for a long time. She experiences occasional pustule-like breakouts on her face and scalp, which she associates with Cymbalta  use.   Patient Active Problem List   Diagnosis Date Noted   Chronic left shoulder pain 02/06/2024   Dermatitis 02/05/2024   Neuropathy 10/22/2023   Trigger index finger of right hand 04/23/2023   Tremor 04/23/2023   Tremor of right hand 04/23/2023   Obesity, morbid (HCC) 02/17/2023   Chronic allergic rhinitis 02/17/2023   Chronic otitis media with effusion, bilateral 02/17/2023   Hypertension associated with diabetes (HCC) 02/17/2023   Mild persistent asthma with (acute) exacerbation 04/15/2022   Other lichen planus 07/13/2020   Osteoarthritis of carpometacarpal (CMC) joint of thumb 03/06/2017   Watery diarrhea 02/12/2015   Lactic acidosis 02/12/2015   Gastroenteritis 02/12/2015   Type 2 diabetes mellitus (HCC) 02/12/2015   Hyperlipidemia associated with type 2 diabetes mellitus (HCC) 02/12/2015   GERD (gastroesophageal reflux disease) 02/12/2015   Anxiety 02/12/2015   Iron deficiency anemia, unspecified 06/10/2011   Obesity 06/10/2011   Insomnia 07/03/2009   Past Medical History:  Diagnosis Date   Allergy    Anxiety  Arthritis    Asthma    Blood transfusion without reported diagnosis    Depression    Diabetes mellitus without complication (HCC)    DVT (deep venous thrombosis) (HCC)    GERD (gastroesophageal reflux disease)    Hypertension       ROS     Objective:     BP (!) 150/84 (Patient Position: Sitting, Cuff Size: Normal)   Pulse 88   Ht 5' (1.524 m)   Wt 213 lb 6.4 oz (96.8 kg)   SpO2 99%   BMI 41.68 kg/m  BP Readings from Last 3 Encounters:  02/05/24 (!) 150/84  12/08/23 (!) 142/88  11/24/23 130/82   Wt Readings from Last 3 Encounters:  02/05/24 213 lb 6.4 oz (96.8 kg)  12/08/23 212 lb 11.2 oz (96.5 kg)  11/24/23 211 lb (95.7 kg)     Physical Exam Vitals reviewed.  Constitutional:      General: She is not in acute distress.    Appearance: Normal appearance. She is not ill-appearing.  Cardiovascular:     Rate and Rhythm: Normal rate and regular rhythm.  Pulmonary:     Effort: Pulmonary effort is normal. No respiratory distress.     Breath sounds: No wheezing, rhonchi or rales.  Musculoskeletal:     Left shoulder: Tenderness present. Decreased range of motion.  Skin:     Neurological:     Mental Status: She is alert and oriented to person, place, and time.  Psychiatric:        Mood and Affect: Mood normal. Affect is tearful.        Behavior: Behavior normal.      Results for orders placed or performed in visit on 02/05/24  HgB A1c  Result Value Ref Range   Hgb A1c MFr Bld 8.0 (H) 4.8 - 5.6 %   Est. average glucose Bld gHb Est-mCnc 183 mg/dL  Comprehensive metabolic panel with GFR  Result Value Ref Range   Glucose 178 (H) 70 - 99 mg/dL   BUN 18 6 - 24 mg/dL   Creatinine, Ser 9.07 0.57 - 1.00 mg/dL   eGFR 74 >40 fO/fpw/8.26   BUN/Creatinine Ratio 20 9 - 23   Sodium 139 134 - 144 mmol/L   Potassium 4.2 3.5 - 5.2 mmol/L   Chloride 98 96 - 106 mmol/L   CO2 25 20 - 29 mmol/L   Calcium  9.8 8.7 - 10.2 mg/dL   Total Protein 7.2 6.0 - 8.5 g/dL   Albumin 4.7 3.8 - 4.9 g/dL   Globulin, Total 2.5 1.5 - 4.5 g/dL   Bilirubin Total 0.6 0.0 - 1.2 mg/dL   Alkaline Phosphatase 132 49 - 135 IU/L   AST 19 0 - 40 IU/L   ALT 21 0 - 32 IU/L  Lipid panel  Result Value Ref Range   Cholesterol, Total 139 100 - 199  mg/dL   Triglycerides 868 0 - 149 mg/dL   HDL 37 (L) >60 mg/dL   VLDL Cholesterol Cal 23 5 - 40 mg/dL   LDL Chol Calc (NIH) 79 0 - 99 mg/dL   Chol/HDL Ratio 3.8 0.0 - 4.4 ratio    Last CBC Lab Results  Component Value Date   WBC 7.8 10/22/2023   HGB 11.1 10/22/2023   HCT 33.6 (L) 10/22/2023   MCV 92 10/22/2023   MCH 30.5 10/22/2023   RDW 13.8 10/22/2023   PLT 281 10/22/2023   Last metabolic panel Lab Results  Component Value Date   GLUCOSE  178 (H) 02/05/2024   NA 139 02/05/2024   K 4.2 02/05/2024   CL 98 02/05/2024   CO2 25 02/05/2024   BUN 18 02/05/2024   CREATININE 0.92 02/05/2024   EGFR 74 02/05/2024   CALCIUM  9.8 02/05/2024   PROT 7.2 02/05/2024   ALBUMIN 4.7 02/05/2024   LABGLOB 2.5 02/05/2024   BILITOT 0.6 02/05/2024   ALKPHOS 132 02/05/2024   AST 19 02/05/2024   ALT 21 02/05/2024   ANIONGAP 11 07/16/2023   Last lipids Lab Results  Component Value Date   CHOL 139 02/05/2024   HDL 37 (L) 02/05/2024   LDLCALC 79 02/05/2024   TRIG 131 02/05/2024   CHOLHDL 3.8 02/05/2024   Last hemoglobin A1c Lab Results  Component Value Date   HGBA1C 8.0 (H) 02/05/2024   Last thyroid  functions No results found for: TSH, T3TOTAL, T4TOTAL, FREET4, THYROIDAB    The 10-year ASCVD risk score (Arnett DK, et al., 2019) is: 6.9%  Outpatient Encounter Medications as of 02/05/2024  Medication Sig   Accu-Chek Softclix Lancets lancets Use 3 (three) times daily for blood sugar testing   albuterol  (PROVENTIL ) (2.5 MG/3ML) 0.083% nebulizer solution Inhale 3 mLs into the lungs every 4 (four) hours for wheezing or shortness of breath   albuterol  (VENTOLIN  HFA) 108 (90 Base) MCG/ACT inhaler Inhale 2 puffs into the lungs every 4 (four) hours as needed for wheezing or shortness of breath.   atorvastatin  (LIPITOR) 20 MG tablet Take 1 tablet (20 mg total) by mouth daily.   carboxymethylcellul-glycerin  (OPTIVE) 0.5-0.9 % ophthalmic solution Apply one drop into both eyes 3 to 4  times a day.   clobetasol  cream (TEMOVATE ) 0.05 % Apply 1 Application topically 2 (two) times daily. Please apply no longer than 2 weeks at a time   DULoxetine  (CYMBALTA ) 60 MG capsule Take 1 capsule (60 mg total) by mouth daily.   fluticasone  (FLONASE ) 50 MCG/ACT nasal spray Place 2 sprays into both nostrils daily.   glipiZIDE  (GLUCOTROL  XL) 5 MG 24 hr tablet Take 1 tablet (5 mg total) by mouth daily with breakfast.   hydrochlorothiazide  (HYDRODIURIL ) 25 MG tablet Take 1 tablet (25 mg total) by mouth daily for high blood pressure   ibuprofen  (ADVIL ) 800 MG tablet Take 1 tablet (800 mg total) by mouth every 8 (eight) hours as needed for pain. Take with food.   insulin  glargine (LANTUS ) 100 UNIT/ML Solostar Pen Inject 80 Units into the skin daily for diabetes   Insulin  Pen Needle (TRUEPLUS 5-BEVEL PEN NEEDLES) 32G X 4 MM MISC Use 5 (five) times daily with insulin  injections.   lidocaine  (LIDODERM ) 5 % Place 1 patch onto the skin daily. Remove & Discard patch within 12 hours or as directed by MD   metFORMIN  (GLUCOPHAGE ) 500 MG tablet Take 2 tablets (1,000 mg total) by mouth 2 (two) times daily with a meal.   mometasone -formoterol  (DULERA ) 200-5 MCG/ACT AERO Inhale 2 puffs into the lungs 2 (two) times daily for asthma. Rinse and spit with water after each use.   nystatin  ointment (MYCOSTATIN ) Apply 1 Application topically 2 (two) times daily.   ondansetron  (ZOFRAN -ODT) 4 MG disintegrating tablet Take 1 tablet (4 mg total) by mouth every 8 (eight) hours as needed for nausea or vomiting.   ondansetron  (ZOFRAN -ODT) 4 MG disintegrating tablet Dissolve 1 tablet (4 mg total) by mouth every 8 (eight) hours as needed for nausea or vomiting.   triamcinolone  (NASACORT ) 55 MCG/ACT AERO nasal inhaler Place 2 sprays into the nose daily.  triamcinolone  ointment (KENALOG ) 0.1 % Apply 1 Application topically 2 (two) times daily.   [DISCONTINUED] fluconazole  (DIFLUCAN ) 150 MG tablet Take 1 tablet (150 mg total) by  mouth once a week for 4 weeks.   [DISCONTINUED] glucose blood test strip Use as instructed to monitor glucose three times daily   [DISCONTINUED] HYDROcodone  bit-homatropine (HYCODAN) 5-1.5 MG/5ML syrup Take 5 mLs by mouth every 8 (eight) hours as needed for cough.   [DISCONTINUED] insulin  lispro (HUMALOG ) 100 UNIT/ML KwikPen Inject 15 Units into the skin 3 (three) times daily.   [DISCONTINUED] promethazine -dextromethorphan (PROMETHAZINE -DM) 6.25-15 MG/5ML syrup Take 2.5 mLs by mouth 4 (four) times daily as needed for cough.   glucose blood test strip Use as instructed to monitor glucose three times daily   insulin  lispro (HUMALOG ) 100 UNIT/ML KwikPen Inject 15 Units into the skin 3 (three) times daily.   No facility-administered encounter medications on file as of 02/05/2024.       Assessment & Plan:   Problem List Items Addressed This Visit     Chronic left shoulder pain    Chronic left shoulder pain Persistent left shoulder pain not relieved by Motrin  or previous injections. Lidocaine  patches discussed as a potential treatment option, though insurance coverage may be challenging. More people respond well to lidocaine  patches than not, but insurance coverage is often an issue. - Prescribed lidocaine  patches for left shoulder pain. - Coordinated with prior authorization team to assist with insurance coverage for lidocaine  patches. -recommended considering follow up with ortho specialist       Relevant Medications   lidocaine  (LIDODERM ) 5 %   Dermatitis   Dermatitis and eczema Recurrent itchy rash, possibly eczema, with dry skin on arms, legs, and back. Previous treatments include Vaseline and Dove products. Dermatology referral suggested for further evaluation and management. Clobetasol  cream prescribed for dry patches, with instructions to use twice daily for no longer than two weeks to avoid skin thinning and color changes. - Referred to dermatology for further evaluation and  management. - Prescribed clobetasol  0.05% cream for dry patches, to be used twice daily for no longer than two weeks      Relevant Medications   clobetasol  cream (TEMOVATE ) 0.05 %   Other Relevant Orders   Ambulatory referral to Dermatology   Hyperlipidemia associated with type 2 diabetes mellitus (HCC)   Hyperlipidemia associated with type 2 diabetes mellitus Chronic condition  - Ordered cholesterol panel. - continue Atorvastatin  20mg  daily       Relevant Medications   insulin  lispro (HUMALOG ) 100 UNIT/ML KwikPen   glipiZIDE  (GLUCOTROL  XL) 5 MG 24 hr tablet   Other Relevant Orders   Lipid panel (Completed)   Hypertension associated with diabetes (HCC)   Chronic  BP elevated in office today  Blood pressure elevated, possibly due to pain and stress. No home monitoring available. - Referred to [facility] pharmacist Isaiah Ferries for blood pressure management support. -continue hydrochlorothiazide  25mg  daily -would recommend adding losartan 50mg  daily if BP remains elevated at follow up visit       Relevant Medications   insulin  lispro (HUMALOG ) 100 UNIT/ML KwikPen   glipiZIDE  (GLUCOTROL  XL) 5 MG 24 hr tablet   Other Relevant Orders   Comprehensive metabolic panel with GFR (Completed)   Insomnia   Other insomnia Chronic  Difficulty sleeping, sometimes requiring medication at night. Emotional effects noted the following day. -will discuss sleep options at follow up if sleep hygiene does not improve symptoms       Mild persistent asthma  with (acute) exacerbation   Chronic  Morning phlegm production, possibly related to sinus issues. Dulera  inhaler prescribed but not yet used. Previous allergy medications ineffective. Guaifenesin was the only effective medication for phlegm in the past. - Encouraged use of Dulera  inhaler as a maintenance dose. - Recommended trial of Zyrtec, Allegra, or Claritin for sinus-related symptoms. -encouraged patient to use Dulera  inhaler as prescribed        Type 2 diabetes mellitus (HCC) - Primary   Chronic  Elevated blood sugars with fasting glucose levels between 190-200 mg/dL. Current regimen includes metformin  1000 mg twice daily, insulin  glargine 80 units, and Humalog  insulin  15 units three times daily (pt states she sometimes has to use 20 units of humalog  due to hyperglycemia PP). Previous intolerance to Ozempic  and Rybelsus  due to gastrointestinal side effects. Concerns about potential side effects of Actos and Jardiance/Farxiga.  - Started glipizide  5 mg daily, with potential to increase to 10 mg if needed. - Referred to pharmacist via [facility] for diabetes management support. - Refilled Humalog  insulin  and test strips. -continue metformin  1000mg  BID  -continue 80 units Lantus  daily  -consider Jardiance or farxiga in the future but I am concerned given her yeast/rash symptoms that she may experience side effects with this class      Relevant Medications   insulin  lispro (HUMALOG ) 100 UNIT/ML KwikPen   glucose blood test strip   glipiZIDE  (GLUCOTROL  XL) 5 MG 24 hr tablet   Other Relevant Orders   HgB A1c (Completed)   AMB Referral [facility] Care Management   Other Visit Diagnoses       Encounter for screening mammogram for malignant neoplasm of breast       Relevant Orders   MM 3D SCREENING MAMMOGRAM BILATERAL BREAST     Pustule       Relevant Orders   Ambulatory referral to Dermatology       Assessment and Plan Assessment & Plan     .   General health maintenance Routine health maintenance discussed, including mammogram and blood work. - Ordered mammogram. - Ordered blood work to update A1c and cholesterol levels.    Return in about 3 months (around 05/05/2024) for DM, HTN, Cholesterol, dermatitis .    Rockie Agent, MD [facility]  "

## 2024-02-06 DIAGNOSIS — G8929 Other chronic pain: Secondary | ICD-10-CM | POA: Insufficient documentation

## 2024-02-06 LAB — COMPREHENSIVE METABOLIC PANEL WITH GFR
ALT: 21 [IU]/L (ref 0–32)
AST: 19 [IU]/L (ref 0–40)
Albumin: 4.7 g/dL (ref 3.8–4.9)
Alkaline Phosphatase: 132 [IU]/L (ref 49–135)
BUN/Creatinine Ratio: 20 (ref 9–23)
BUN: 18 mg/dL (ref 6–24)
Bilirubin Total: 0.6 mg/dL (ref 0.0–1.2)
CO2: 25 mmol/L (ref 20–29)
Calcium: 9.8 mg/dL (ref 8.7–10.2)
Chloride: 98 mmol/L (ref 96–106)
Creatinine, Ser: 0.92 mg/dL (ref 0.57–1.00)
Globulin, Total: 2.5 g/dL (ref 1.5–4.5)
Glucose: 178 mg/dL — ABNORMAL HIGH (ref 70–99)
Potassium: 4.2 mmol/L (ref 3.5–5.2)
Sodium: 139 mmol/L (ref 134–144)
Total Protein: 7.2 g/dL (ref 6.0–8.5)
eGFR: 74 mL/min/{1.73_m2}

## 2024-02-06 LAB — HEMOGLOBIN A1C
Est. average glucose Bld gHb Est-mCnc: 183 mg/dL
Hgb A1c MFr Bld: 8 % — ABNORMAL HIGH (ref 4.8–5.6)

## 2024-02-06 LAB — LIPID PANEL
Chol/HDL Ratio: 3.8 ratio (ref 0.0–4.4)
Cholesterol, Total: 139 mg/dL (ref 100–199)
HDL: 37 mg/dL — ABNORMAL LOW
LDL Chol Calc (NIH): 79 mg/dL (ref 0–99)
Triglycerides: 131 mg/dL (ref 0–149)
VLDL Cholesterol Cal: 23 mg/dL (ref 5–40)

## 2024-02-06 NOTE — Assessment & Plan Note (Signed)
 Other insomnia Chronic  Difficulty sleeping, sometimes requiring medication at night. Emotional effects noted the following day. -will discuss sleep options at follow up if sleep hygiene does not improve symptoms

## 2024-02-06 NOTE — Assessment & Plan Note (Signed)
 Chronic  Elevated blood sugars with fasting glucose levels between 190-200 mg/dL. Current regimen includes metformin  1000 mg twice daily, insulin  glargine 80 units, and Humalog  insulin  15 units three times daily (pt states she sometimes has to use 20 units of humalog  due to hyperglycemia PP). Previous intolerance to Ozempic  and Rybelsus  due to gastrointestinal side effects. Concerns about potential side effects of Actos and Jardiance/Farxiga.  - Started glipizide  5 mg daily, with potential to increase to 10 mg if needed. - Referred to pharmacist via [facility] for diabetes management support. - Refilled Humalog  insulin  and test strips. -continue metformin  1000mg  BID  -continue 80 units Lantus  daily  -consider Jardiance or farxiga in the future but I am concerned given her yeast/rash symptoms that she may experience side effects with this class

## 2024-02-06 NOTE — Assessment & Plan Note (Signed)
 Hyperlipidemia associated with type 2 diabetes mellitus Chronic condition  - Ordered cholesterol panel. - continue Atorvastatin  20mg  daily

## 2024-02-06 NOTE — Assessment & Plan Note (Signed)
" °  Chronic left shoulder pain Persistent left shoulder pain not relieved by Motrin  or previous injections. Lidocaine  patches discussed as a potential treatment option, though insurance coverage may be challenging. More people respond well to lidocaine  patches than not, but insurance coverage is often an issue. - Prescribed lidocaine  patches for left shoulder pain. - Coordinated with prior authorization team to assist with insurance coverage for lidocaine  patches. -recommended considering follow up with ortho specialist  "

## 2024-02-06 NOTE — Assessment & Plan Note (Signed)
 Dermatitis and eczema Recurrent itchy rash, possibly eczema, with dry skin on arms, legs, and back. Previous treatments include Vaseline and Dove products. Dermatology referral suggested for further evaluation and management. Clobetasol  cream prescribed for dry patches, with instructions to use twice daily for no longer than two weeks to avoid skin thinning and color changes. - Referred to dermatology for further evaluation and management. - Prescribed clobetasol  0.05% cream for dry patches, to be used twice daily for no longer than two weeks

## 2024-02-06 NOTE — Assessment & Plan Note (Signed)
 Chronic  Morning phlegm production, possibly related to sinus issues. Dulera  inhaler prescribed but not yet used. Previous allergy medications ineffective. Guaifenesin was the only effective medication for phlegm in the past. - Encouraged use of Dulera  inhaler as a maintenance dose. - Recommended trial of Zyrtec, Allegra, or Claritin for sinus-related symptoms. -encouraged patient to use Dulera  inhaler as prescribed

## 2024-02-07 ENCOUNTER — Other Ambulatory Visit: Payer: Self-pay

## 2024-02-08 ENCOUNTER — Other Ambulatory Visit: Payer: Self-pay

## 2024-02-09 NOTE — Progress Notes (Unsigned)
 Care Guide Pharmacy Note  02/09/2024 Name: Jamie Massey MRN: [id number] DOB: [birth year]  Referred By: Sharma Coyer, MD Reason for referral: Complex Care Management, Call Attempt #1 (Unsuccessful initial outreach to schedule with PHARM D- Allyson), and Call Attempt #2 (Unsuccessful initial outreach to schedule with PHARM D- Allyson)   Jamie Massey is a 54 y.o. year old female who is a primary care patient of Simmons-Robinson, Coyer, MD.  Jamie Massey was referred to the pharmacist for assistance related to: DMII  A second unsuccessful telephone outreach was attempted today to contact the patient who was referred to the pharmacy team for assistance with Disease management. Additional attempts will be made to contact the patient.  Leotis Rase [facility] [place], [facility] Guide  Direct Dial : [phone]  Fax [phone]

## 2024-02-10 ENCOUNTER — Other Ambulatory Visit: Payer: Self-pay

## 2024-02-10 ENCOUNTER — Telehealth: Payer: Self-pay

## 2024-02-11 ENCOUNTER — Other Ambulatory Visit (HOSPITAL_COMMUNITY): Payer: Self-pay

## 2024-02-12 ENCOUNTER — Ambulatory Visit: Payer: Self-pay | Admitting: Family Medicine

## 2024-02-12 NOTE — Telephone Encounter (Signed)
-----   Message from Rockie Agent, MD sent at 02/12/2024  7:01 AM EST ----- A1c has increased from 7.4 to 8. Goal is less than 7 for well controlled diabetes. Please start new medication and continue meds as discussed during our last visit. The [facility] pharmacist will help with  diabetes management as well   Kidney, liver function within normal limits.  Electrolytes were within normal range  Cholesterol is within goal range

## 2024-02-18 ENCOUNTER — Ambulatory Visit

## 2024-05-11 ENCOUNTER — Ambulatory Visit: Admitting: Family Medicine
